# Patient Record
Sex: Female | Born: 1935 | Race: Black or African American | Hispanic: No | Smoking: Never smoker
Health system: Southern US, Community
[De-identification: ages and names within clinical notes are randomized; demographics above are authoritative.]

## PROBLEM LIST (undated history)

## (undated) DIAGNOSIS — M1990 Unspecified osteoarthritis, unspecified site: Secondary | ICD-10-CM

## (undated) DIAGNOSIS — F0390 Unspecified dementia without behavioral disturbance: Secondary | ICD-10-CM

## (undated) DIAGNOSIS — M1710 Unilateral primary osteoarthritis, unspecified knee: Secondary | ICD-10-CM

## (undated) DIAGNOSIS — I10 Essential (primary) hypertension: Secondary | ICD-10-CM

## (undated) DIAGNOSIS — E7800 Pure hypercholesterolemia, unspecified: Secondary | ICD-10-CM

## (undated) DIAGNOSIS — I350 Nonrheumatic aortic (valve) stenosis: Secondary | ICD-10-CM

## (undated) DIAGNOSIS — E785 Hyperlipidemia, unspecified: Secondary | ICD-10-CM

## (undated) DIAGNOSIS — E669 Obesity, unspecified: Secondary | ICD-10-CM

## (undated) DIAGNOSIS — M179 Osteoarthritis of knee, unspecified: Secondary | ICD-10-CM

## (undated) DIAGNOSIS — I2510 Atherosclerotic heart disease of native coronary artery without angina pectoris: Secondary | ICD-10-CM

## (undated) HISTORY — DX: Unspecified osteoarthritis, unspecified site: M19.90

## (undated) HISTORY — DX: Pure hypercholesterolemia, unspecified: E78.00

## (undated) HISTORY — DX: Hyperlipidemia, unspecified: E78.5

## (undated) HISTORY — DX: Unilateral primary osteoarthritis, unspecified knee: M17.10

## (undated) HISTORY — DX: Atherosclerotic heart disease of native coronary artery without angina pectoris: I25.10

## (undated) HISTORY — DX: Essential (primary) hypertension: I10

## (undated) HISTORY — DX: Obesity, unspecified: E66.9

## (undated) HISTORY — DX: Nonrheumatic aortic (valve) stenosis: I35.0

## (undated) HISTORY — PX: BACK SURGERY: SHX140

## (undated) HISTORY — DX: Osteoarthritis of knee, unspecified: M17.9

---

## 2003-01-12 ENCOUNTER — Ambulatory Visit (HOSPITAL_COMMUNITY): Admission: RE | Admit: 2003-01-12 | Discharge: 2003-01-12 | Payer: Self-pay | Admitting: *Deleted

## 2003-01-19 ENCOUNTER — Ambulatory Visit (HOSPITAL_COMMUNITY): Admission: RE | Admit: 2003-01-19 | Discharge: 2003-01-19 | Payer: Self-pay | Admitting: *Deleted

## 2003-10-18 ENCOUNTER — Ambulatory Visit (HOSPITAL_COMMUNITY): Admission: RE | Admit: 2003-10-18 | Discharge: 2003-10-18 | Payer: Self-pay | Admitting: Family Medicine

## 2006-03-21 ENCOUNTER — Ambulatory Visit (HOSPITAL_COMMUNITY): Admission: RE | Admit: 2006-03-21 | Discharge: 2006-03-21 | Payer: Self-pay | Admitting: Gastroenterology

## 2010-06-25 ENCOUNTER — Encounter: Payer: Self-pay | Admitting: Cardiology

## 2010-06-28 ENCOUNTER — Encounter: Payer: Self-pay | Admitting: Cardiology

## 2010-06-28 ENCOUNTER — Ambulatory Visit (INDEPENDENT_AMBULATORY_CARE_PROVIDER_SITE_OTHER): Payer: PRIVATE HEALTH INSURANCE | Admitting: Cardiology

## 2010-06-28 VITALS — BP 140/80 | HR 74 | Ht 61.0 in | Wt 193.0 lb

## 2010-06-28 DIAGNOSIS — I350 Nonrheumatic aortic (valve) stenosis: Secondary | ICD-10-CM | POA: Insufficient documentation

## 2010-06-28 DIAGNOSIS — E119 Type 2 diabetes mellitus without complications: Secondary | ICD-10-CM | POA: Insufficient documentation

## 2010-06-28 DIAGNOSIS — E7800 Pure hypercholesterolemia, unspecified: Secondary | ICD-10-CM

## 2010-06-28 DIAGNOSIS — I10 Essential (primary) hypertension: Secondary | ICD-10-CM | POA: Insufficient documentation

## 2010-06-28 DIAGNOSIS — I359 Nonrheumatic aortic valve disorder, unspecified: Secondary | ICD-10-CM

## 2010-06-28 NOTE — Assessment & Plan Note (Signed)
[name] [name] has moderate to severe aortic stenosis. She is asymptomatic. I have recommended a followup echocardiogram to compare with her study in 2011. If there is no significant change we will clear her for her knee surgery. If her aortic valve disease has progressed significantly we will need to consider further evaluation with cardiac catheterization and potential valve replacement. I did explain to the patient that she will eventually need aortic valve surgery.

## 2010-06-28 NOTE — Assessment & Plan Note (Signed)
Patient has severe hypercholesterolemia but is intolerant to statin therapy. She is on fenofibrate.

## 2010-06-28 NOTE — Patient Instructions (Signed)
We will schedule you for an echocardiogram to assess your heart valve.  If it is unchanged from last year we will clear you for your knee surgery.  If the aortic stenosis has worsened we may need to evaluate your valve further.

## 2010-06-28 NOTE — Progress Notes (Signed)
   [name] [name] Date of Birth: [birth year]   History of Present Illness: [name] [name] is seen for preoperative clearance for right knee surgery. She has a known history of aortic stenosis. Her last echocardiogram was in June of 2011 which demonstrated a peak velocity of 3 m/s with a mean gradient of 21 mm of mercury and an aortic valve area of 0.8-1.0 cm. This was stable compared to prior echocardiogram in February 2010. [name] [name] remains asymptomatic. She denies any chest pain, shortness of breath, or dizziness. She's had no palpitations. She is somewhat limited in her activity because of her knee problem.  Current Outpatient Prescriptions on File Prior to Visit  Medication Sig Dispense Refill  . DIOVAN HCT 160-25 MG per tablet Take 1 tablet by mouth Daily.      . FENOFIBRATE PO Take by mouth daily.        Marland Kitchen METFORMIN HCL PO Take 1,000 mg by mouth 2 (two) times daily.       . metoprolol succinate (TOPROL-XL) 25 MG 24 hr tablet Take 1 tablet by mouth Daily.      Marland Kitchen DISCONTD: Olmesartan Medoxomil-HCTZ (BENICAR HCT PO) Take by mouth daily.          Allergies  Allergen Reactions  . Aspirin   . Crestor (Rosuvastatin Calcium)   . Lipitor (Atorvastatin Calcium)   . Metoprolol   . Norvasc (Amlodipine Besylate)     Past Medical History  Diagnosis Date  . Diabetes mellitus   . Hypertension   . Aortic stenosis   . Hypercholesterolemia   . OA (osteoarthritis)     No past surgical history on file.  History  Smoking status  . Never Smoker   Smokeless tobacco  . Not on file    History  Alcohol Use No    Family History  Problem Relation Age of Onset  . Tuberculosis Mother   . Heart disease Father     Review of Systems: The review of systems is positive for arthritic pain in her right knee.  All other systems were reviewed and are negative.  Physical Exam: BP 140/80  Pulse 74  Ht 5\' 1"  (1.549 m)  Wt 193 lb (87.544 kg)  BMI 36.47 kg/m2 She is an overweight black  female in no acute distress. HEENT exam is unremarkable. Neck is without JVD, adenopathy, thyromegaly, or bruits. Her carotid upstrokes are good. She does have a radiated murmur. Lungs are clear. Cardiac exam reveals a harsh grade 2/6 systolic murmur in aortic area radiating to the left sternal border. She has normal S2. There is no diastolic murmur. Abdomen is soft and nontender without masses or bruits. Studies are without edema. Pulses are 2+ and symmetric. Skin is warm and dry. She is alert and oriented x3. Cranial nerves II through XII are intact. LABORATORY DATA: Blood work dated June 22, 2010 shows a hemoglobin of 10.8 and hematocrit 34.2. White blood count and platelet count were normal. Glucose 141. A1c was 7%. TSH was 2.43. Cholesterol 292, triglycerides 103, HDL 60, LDL 211. Chemistries were otherwise normal. ECG showed sinus bradycardia with nonspecific ST abnormality.  Assessment / Plan:

## 2010-07-11 ENCOUNTER — Ambulatory Visit (HOSPITAL_COMMUNITY): Payer: Medicare (Managed Care) | Attending: Cardiology | Admitting: Radiology

## 2010-07-11 DIAGNOSIS — I359 Nonrheumatic aortic valve disorder, unspecified: Secondary | ICD-10-CM

## 2010-07-11 DIAGNOSIS — I079 Rheumatic tricuspid valve disease, unspecified: Secondary | ICD-10-CM | POA: Insufficient documentation

## 2010-07-11 DIAGNOSIS — I379 Nonrheumatic pulmonary valve disorder, unspecified: Secondary | ICD-10-CM | POA: Insufficient documentation

## 2010-07-11 DIAGNOSIS — E785 Hyperlipidemia, unspecified: Secondary | ICD-10-CM | POA: Insufficient documentation

## 2010-07-11 DIAGNOSIS — I350 Nonrheumatic aortic (valve) stenosis: Secondary | ICD-10-CM

## 2010-07-11 DIAGNOSIS — I080 Rheumatic disorders of both mitral and aortic valves: Secondary | ICD-10-CM | POA: Insufficient documentation

## 2010-07-11 DIAGNOSIS — I10 Essential (primary) hypertension: Secondary | ICD-10-CM | POA: Insufficient documentation

## 2010-07-11 DIAGNOSIS — E119 Type 2 diabetes mellitus without complications: Secondary | ICD-10-CM | POA: Insufficient documentation

## 2010-07-13 ENCOUNTER — Telehealth: Payer: Self-pay | Admitting: *Deleted

## 2010-07-13 NOTE — Telephone Encounter (Signed)
Spoke w/ daughter Almyra Free. Advised of Echo results. Made her an app to see Dr. Swaziland 7/10 to discuss cath. Sent note to Dr. Sherene Sires office that she was no cleared for total knee replacement until has cath. Advised daughter that she would not be cleared.

## 2010-07-13 NOTE — Telephone Encounter (Signed)
Message copied by Lorayne Bender on Fri Jul 13, 2010  1:38 PM ------      Message from: [place], PETER M      Created: Fri Jul 13, 2010 12:20 PM       Her aortic stenosis has progressed significantly since last year with increase in velocity from 3 to 4.1 cm/sec and increase in mean gradient from 21 to 39 mmHg. I think we need to consider her for cardiac cath and possible valve replacement. I will not clear her for knee surgery. I would like to see her back to review results and discuss cath.       Theron Arista [place]

## 2010-07-20 ENCOUNTER — Other Ambulatory Visit (HOSPITAL_COMMUNITY): Payer: Self-pay

## 2010-07-24 ENCOUNTER — Ambulatory Visit (INDEPENDENT_AMBULATORY_CARE_PROVIDER_SITE_OTHER): Payer: Medicare (Managed Care) | Admitting: Cardiology

## 2010-07-24 ENCOUNTER — Encounter: Payer: Self-pay | Admitting: Cardiology

## 2010-07-24 ENCOUNTER — Ambulatory Visit
Admission: RE | Admit: 2010-07-24 | Discharge: 2010-07-24 | Disposition: A | Discharge status: Home / Self Care | Payer: Medicare (Managed Care) | Source: Ambulatory Visit | Attending: Cardiology | Admitting: Cardiology

## 2010-07-24 VITALS — BP 188/104 | HR 72 | Ht 63.0 in | Wt 193.6 lb

## 2010-07-24 DIAGNOSIS — M79609 Pain in unspecified limb: Secondary | ICD-10-CM

## 2010-07-24 DIAGNOSIS — I350 Nonrheumatic aortic (valve) stenosis: Secondary | ICD-10-CM

## 2010-07-24 DIAGNOSIS — I359 Nonrheumatic aortic valve disorder, unspecified: Secondary | ICD-10-CM

## 2010-07-24 LAB — CBC WITH DIFFERENTIAL/PLATELET
Basophils Absolute: 0 10*3/uL (ref 0.0–0.1)
Basophils Relative: 0.4 % (ref 0.0–3.0)
HCT: 32.4 % — ABNORMAL LOW (ref 36.0–46.0)
Hemoglobin: 10.7 g/dL — ABNORMAL LOW (ref 12.0–15.0)
Lymphocytes Relative: 30.2 % (ref 12.0–46.0)
Lymphs Abs: 1.5 10*3/uL (ref 0.7–4.0)
MCHC: 32.9 g/dL (ref 30.0–36.0)
Monocytes Relative: 7.5 % (ref 3.0–12.0)
Neutro Abs: 3 10*3/uL (ref 1.4–7.7)
RBC: 4.07 Mil/uL (ref 3.87–5.11)
RDW: 16.5 % — ABNORMAL HIGH (ref 11.5–14.6)

## 2010-07-24 LAB — APTT: aPTT: 31.7 s — ABNORMAL HIGH (ref 21.7–28.8)

## 2010-07-24 LAB — BASIC METABOLIC PANEL
CO2: 27 mEq/L (ref 19–32)
Calcium: 8.8 mg/dL (ref 8.4–10.5)
GFR: 86.47 mL/min (ref >60.00)
Glucose, Bld: 135 mg/dL — ABNORMAL HIGH (ref 70–99)
Potassium: 4.1 mEq/L (ref 3.5–5.1)
Sodium: 141 mEq/L (ref 135–145)

## 2010-07-24 NOTE — Assessment & Plan Note (Signed)
Her aortic stenosis has progressed significantly. It is now severe. Her serial echocardiograms have demonstrated progressive disease. It is difficult to tell if she is symptomatic since her activity is limited. I recommended pursuing diagnostic cardiac catheterization. I would recommend elective aortic valve replacement. We'll postpone her knee surgery as this is elective.

## 2010-07-24 NOTE — Progress Notes (Signed)
   [name] Date of Birth: February 15, 1934   History of Present Illness: Mrs. Niese is seen for followup after recent echocardiogram. This demonstrated significant increase in aortic valve gradient with a peak velocity of 4 m/s and a mean gradient of 39 mm of mercury. Her valve area was 0.85 cm. She denies significant chest pain, shortness of breath, or lightheadedness. Her activity is very limited because of her osteoarthritis. Her daughter notes that she does appear to get out of breath easily with activity.  Current Outpatient Prescriptions on File Prior to Visit  Medication Sig Dispense Refill  . DIOVAN HCT 160-25 MG per tablet Take 1 tablet by mouth Daily.      . FENOFIBRATE PO Take by mouth daily.        Marland Kitchen METFORMIN HCL PO Take 1,000 mg by mouth 2 (two) times daily.       . metoprolol succinate (TOPROL-XL) 25 MG 24 hr tablet Take 1 tablet by mouth Daily.        Allergies  Allergen Reactions  . Aspirin   . Crestor (Rosuvastatin Calcium)   . Lipitor (Atorvastatin Calcium)   . Metoprolol   . Norvasc (Amlodipine Besylate)     Past Medical History  Diagnosis Date  . Diabetes mellitus   . Hypertension   . Aortic stenosis   . Hypercholesterolemia   . OA (osteoarthritis)     History reviewed. No pertinent past surgical history.  History  Smoking status  . Never Smoker   Smokeless tobacco  . Not on file    History  Alcohol Use No    Family History  Problem Relation Age of Onset  . Tuberculosis Mother   . Heart disease Father     Review of Systems: The review of systems is positive for arthritic pain in her right knee.  All other systems were reviewed and are negative.  Physical Exam: BP 188/104  Pulse 72  Ht 5\' 3"  (1.6 m)  Wt 193 lb 9.6 oz (87.816 kg)  BMI 34.29 kg/m2 She is an overweight black female in no acute distress. HEENT exam is unremarkable. Neck is without JVD, adenopathy, thyromegaly, or bruits. Her carotid upstrokes are good. She does have a  radiated murmur. Lungs are clear. Cardiac exam reveals a harsh grade 2/6 systolic murmur in aortic area radiating to the left sternal border. She has normal S2. There is no diastolic murmur. Abdomen is soft and nontender without masses or bruits. Studies are without edema. Pulses are 2+ and symmetric. Skin is warm and dry. She is alert and oriented x3. Cranial nerves II through XII are intact. LABORATORY DATA: Blood work dated June 22, 2010 shows a hemoglobin of 10.8 and hematocrit 34.2. White blood count and platelet count were normal. Glucose 141. A1c was 7%. TSH was 2.43. Cholesterol 292, triglycerides 103, HDL 60, LDL 211. Chemistries were otherwise normal. ECG showed sinus bradycardia with nonspecific ST abnormality.  Assessment / Plan:

## 2010-07-24 NOTE — Patient Instructions (Signed)
We will schedule you for a cardiac catheterization.  Continue your current medications.   We will check lab work and a chest Xray today.Angiography Angiography is a procedure used to look at the blood vessels (arteries) which carry the blood to different parts of your body. In this procedure a dye is injected through a catheter (a long, hollow tube about the size of a piece of cooked spaghetti) into an artery and x-rays are taken. The x-rays will show if there is a blockage or problem in a blood vessel.  PREPARATION FOR THE PROCEDURE  Let your caregiver know if you have had an allergy to dyes used in x-ray if you have ever had kidney problems or failure.   Do not eat or drink starting from midnight up to the time of the procedure, or as directed.   You may drink enough water to take your medications the morning of the procedure if you were instructed to do so.   You should be at the hospital or outpatient facility where the procedure is to be done 1 hour prior to the procedure or as directed.  PROCEDURE: 1. You may be given a medication to help you relax before and during the procedure through an IV in your hand or arm.  2. A local anesthetic to make the area numb may be used before inserting the catheter.  3. You will be prepared for the procedure by washing and shaving the area where the catheter will be inserted. This is usually done in the groin but may be done in the fold of your arm by your elbow.  4. A specially trained doctor will insert the catheter with a guide wire into an artery. This is guided under a special type of x-ray (fluoroscopy) to the blood vessel being examined.  5. Special dye is then injected and x-rays are taken. These will show where any narrowing or blockages are located.  AFTER THE PROCEDURE  After the procedure you will be kept in bed for several hours.   The access site will be watched and you will be checked frequently.   Blood tests, other x-rays and an  EKG may be done.   You may stay in the hospital overnight for observation.  SEEK IMMEDIATE MEDICAL CARE IF:  You develop chest pain, shortness of breath, feel faint, or pass out.   There is bleeding, swelling, or drainage from the catheter insertion site.   You develop pain, discoloration, coldness, or severe bruising in the leg or arm, or area where the catheter was inserted.  Document Released: 10/10/2004 Document Re-Released: 05/02/2007 [facility] Patient Information 2011 [place], [place].

## 2010-07-25 ENCOUNTER — Inpatient Hospital Stay (HOSPITAL_COMMUNITY)
Admission: RE | Admit: 2010-07-25 | Payer: Medicare (Managed Care) | Source: Ambulatory Visit | Admitting: Orthopedic Surgery

## 2010-07-25 ENCOUNTER — Inpatient Hospital Stay (HOSPITAL_BASED_OUTPATIENT_CLINIC_OR_DEPARTMENT_OTHER)
Admission: RE | Admit: 2010-07-25 | Discharge: 2010-07-25 | Disposition: A | Discharge status: Home / Self Care | Payer: Medicare (Managed Care) | Source: Ambulatory Visit | Attending: Cardiology | Admitting: Cardiology

## 2010-07-25 DIAGNOSIS — I359 Nonrheumatic aortic valve disorder, unspecified: Secondary | ICD-10-CM | POA: Insufficient documentation

## 2010-07-25 DIAGNOSIS — I2510 Atherosclerotic heart disease of native coronary artery without angina pectoris: Secondary | ICD-10-CM | POA: Insufficient documentation

## 2010-07-25 DIAGNOSIS — R0989 Other specified symptoms and signs involving the circulatory and respiratory systems: Secondary | ICD-10-CM | POA: Insufficient documentation

## 2010-07-25 DIAGNOSIS — R0609 Other forms of dyspnea: Secondary | ICD-10-CM | POA: Insufficient documentation

## 2010-07-25 HISTORY — PX: CARDIAC CATHETERIZATION: SHX172

## 2010-07-25 LAB — POCT I-STAT 3, VENOUS BLOOD GAS (G3P V)
Acid-base deficit: 3 mmol/L — ABNORMAL HIGH (ref 0.0–2.0)
Bicarbonate: 23.7 mEq/L (ref 20.0–24.0)
O2 Saturation: 70 %
TCO2: 25 mmol/L (ref 0–100)
pCO2, Ven: 48.9 mmHg (ref 45.0–50.0)
pO2, Ven: 41 mmHg (ref 30.0–45.0)

## 2010-07-25 LAB — POCT I-STAT 3, ART BLOOD GAS (G3+)
Bicarbonate: 23.8 mEq/L (ref 20.0–24.0)
TCO2: 25 mmol/L (ref 0–100)
pH, Arterial: 7.355 (ref 7.350–7.400)
pO2, Arterial: 98 mmHg (ref 80.0–100.0)

## 2010-07-27 ENCOUNTER — Telehealth: Payer: Self-pay | Admitting: *Deleted

## 2010-07-27 NOTE — Telephone Encounter (Signed)
Had called daughter to clarify dates for FMLA form. She returned called and said her mother has an app w/surgeon on 7/24 to discuss when surgery will be. Will fill form out for intermittent leave from 07/25/10-08/07/10. Faxed to Rosann Auerbach [phone] att: Satira Mccallum.

## 2010-08-02 NOTE — Cardiovascular Report (Signed)
  NAMEMarland Kitchen  [name], [name] NO.:  [id number]  MEDICAL RECORD NO.:  [id number]  LOCATION:                                 FACILITY:  PHYSICIAN:  Peter M. Swaziland, M.D.  DATE OF BIRTH:  07-22-1934  DATE OF PROCEDURE:  07/25/2010 DATE OF DISCHARGE:                           CARDIAC CATHETERIZATION   INDICATIONS FOR PROCEDURE:  A 77 year old female who was seen for preoperative clearance for knee surgery.  She has progressive aortic stenosis that is now severe.  She does have symptoms of dyspnea. Cardiac catheterization was indicated for evaluation for surgical treatment of her aortic valve disease.  PROCEDURES:  Right and left heart catheterization, coronary angiography, access via the right femoral artery and vein using the standard Seldinger technique.  EQUIPMENT:  A 4-French arterial sheath, 4-French pigtail catheter, 4- French 4-cm left Judkins catheter, 4-French 3D RC catheter, 7-French balloon-tip Swan-Ganz catheter, 7-French venous sheath.  MEDICATIONS:  Local anesthesia with 1% Xylocaine, Versed 1 mg IV, fentanyl 25 mcg.  CONTRAST:  70 mL of Omnipaque.  HEMODYNAMIC DATA:  Right atrial pressure is 10/7 with a mean of 7 mmHg. Right ventricular pressure is 30 with an EDP of 8 mmHg.  Pulmonary artery pressure is 28/9 with a mean of 17 mmHg.  Pulmonary capillary wedge pressure is 14/12 with a mean of 5 mmHg.  By pullback, left ventricular pressure is 165 with an EDP of 15 mmHg.  Aortic pressure is 143/60 with a mean of 92 mmHg.  Aortic valve mean gradient is 26 mmHg. Using thermodilution cardiac output, it shows valve area of 0.77 cm2, valve area of 0.41.  Valve area index of 0.41.  Thermodilution cardiac output was 3.8 L per minute with an index of 2.1.  By [facility] cardiac output 5.9 with an index of 8.2.  ANGIOGRAPHIC DATA:  Left coronary artery arises and distributes normally.  Left main coronary artery is normal.  Left anterior descending artery is  moderately calcified throughout the proximal mid vessel.  There are tandem focal lesions in mid LAD at 78% each.  Left circumflex coronary artery is a large vessel which has a 20% ostial stenosis.  The right coronary artery is a dominant vessel that has mild irregularity __________  Left ventricular angiography was not performed.  IMPRESSION: 1. Single-vessel obstructive atherosclerotic coronary artery disease. 2. Severe aortic stenosis. 3. Normal right heart pressures.  PLAN:  Obtain surgical consultation with possible aortic valve replacement and bypass surgery.          ______________________________ Peter M. Swaziland, M.D.     PMJ/MEDQ  D:  07/25/2010  T:  07/26/2010  Job:  [id number]  Electronically Signed by PETER Swaziland M.D. on 08/02/2010 11:48:32 AM

## 2010-08-07 ENCOUNTER — Encounter: Payer: Medicare (Managed Care) | Admitting: Surgery

## 2010-08-07 ENCOUNTER — Encounter (INDEPENDENT_AMBULATORY_CARE_PROVIDER_SITE_OTHER): Payer: Medicare (Managed Care) | Admitting: Surgery

## 2010-08-07 DIAGNOSIS — I2510 Atherosclerotic heart disease of native coronary artery without angina pectoris: Secondary | ICD-10-CM

## 2010-08-07 DIAGNOSIS — I359 Nonrheumatic aortic valve disorder, unspecified: Secondary | ICD-10-CM

## 2010-08-09 NOTE — Consult Note (Addendum)
NEW PATIENT CONSULTATION  [name] [name], [name] [name] DOB:  1934/09/15                                        August 08, 2010 CHART #:  [id number]  REASON FOR CONSULTATION:  Severe aortic stenosis and single-vessel coronary artery disease.  CLINICAL HISTORY:  I was asked by Dr. Swaziland to evaluate the patient for consideration of aortic valve replacement and coronary artery bypass to the LAD.  She is a 76 year old woman with a history of hypertension and hypercholesterolemia as well as diabetes and known aortic stenosis.  She was undergoing preop cardiac evaluation for planned right knee replacement for severe degenerative arthritis.  She denied any significant symptoms.  She had a echocardiogram on July 11, 2010, which showed progression of her aortic stenosis into the severe range with a mean gradient of 39, a peak gradient of 69 with an aortic valve area of 0.9 cm2 by VTI and 0.8 cm2 by VMAX.  Left ventricular ejection fraction was 55-60% with no regional wall motion abnormalities.  There is moderate concentric left ventricular hypertrophy.  There is a calcified mitral annulus with no evidence of stenosis and mild regurgitation. Right ventricular function was normal.  There is trivial tricuspid regurgitation.  There is trivial pulmonary valve regurgitation.  She subsequently underwent cardiac catheterization by Dr. Swaziland on July 25, 2010.  This showed a right atrial pressure of 10/7 with a mean of 7. Right ventricular pressure was 30.  PA pressure was 28/9 with a wedge pressure of 14/12 and a mean of 5.  The aortic valve mean gradient was 26 mmHg.  Valve area was calculated 0.77 cm2 by thermodilution and 0.41 by Fick.  Angiography showed the LAD to have tandem focal 70-80% stenoses in the midportion of the LAD.  The left circumflex and right coronary arteries had no significant disease.  REVIEW OF SYSTEMS:  GENERAL:  She denies any fever or chills.  She has had some  weight gain.  She said that she does not stick to a good diet. She does not check her blood sugars.  She denies any fatigue, but is not very active due to her right knee pain. EYES:  Negative. ENT:  Negative. ENDOCRINE:  She has adult-onset diabetes.  She does not check her blood sugars.  All of them are due to the expense of the strips.  She denies hypothyroidism.  CARDIOVASCULAR:  She denies any chest pain or pressure. She denies exertional dyspnea.  She denies PND and orthopnea. EXTREMITIES:  She has had no peripheral edema or palpitations. RESPIRATORY:  She denies cough and sputum production. GI:  She denies nausea or vomiting.  She denies melena and bright red blood per rectum. GU:  She denies dysuria and hematuria. VASCULAR:  She denies claudication and phlebitis. MUSCULOSKELETAL:  She does have degenerative arthritis in her right knee and is supposed to undergo a right knee replacement by Dr. Richardson Landry. NEUROLOGICAL:  She denies any focal weakness or numbness.  She denies dizziness or syncope.  She has never had a TIA or stroke. PSYCHIATRIC:  Negative. HEMATOLOGICAL:  Negative.  ALLERGIES:  To aspirin which causes nausea or vomiting as well as Crestor and Lipitor both of which caused back pain in her muscles.  She also is allergic to Norvasc causing swelling.  PAST MEDICAL HISTORY:  Significant for diabetes mellitus that is type 2 as  well as hypertension and hypercholesterolemia.  She has known history of aortic stenosis.  She has history of degenerative arthritis of her right knee.  FAMILY HISTORY:  Negative for cardiac or valve disease.  SOCIAL HISTORY:  She is widowed and has 3 adult children.  Two of her daughters are with her today.  She has never smoked.  Denies alcohol use.  PHYSICAL EXAMINATION:  Vital Signs:  Her blood pressure is 146/72, pulse 56 and regular, respiratory rate is 18 and unlabored and oxygen saturation on room air is 98%. General:  She is a  well-developed black female, in no distress.  HEENT: Normocephalic and atraumatic.  Pupils are equal and reactive to light and accommodation.  Extraocular muscles are intact.  Oropharynx is clear.  Neck:  Normal carotid pulses bilaterally.  There is a transmitted murmur or bruit to both sides of her neck.  There is no adenopathy or thyromegaly.  Cardiac:  Regular rate and rhythm with a grade 2/6 systolic murmur over the aorta.  There is no diastolic murmur. Lungs:  Clear.  Abdomen:  Active bowel sounds.  Abdomen is soft and nontender.  There are no palpable masses or organomegaly.  Extremities: No peripheral edema.  Pedal pulses are palpable bilaterally.  Skin: Warm and dry.  Neurologic:  Alert and oriented x3.  Motor and sensory exams are grossly normal.  IMPRESSION:  The patient has severe aortic stenosis and significant single-vessel coronary artery disease involving the LAD.  She really denies any symptoms, although she is very sedentary due to her degenerative arthritis.  I agree that she should undergo aortic valve replacement and coronary artery bypass to the LAD to decrease her risk of ischemia and infarction as well as progressive congestive heart failure symptoms secondary to aortic stenosis.  This should be done prior to her undergoing knee replacement.  If her surgery goes well, I would expect that she would be able to have her knee replaced after about 3 months recovery.  I discussed the operative procedure of aortic valve replacement and coronary artery bypass x1 to the LAD with her and her 2 daughters.  We discussed alternatives, benefits, and risks including, but not limited to bleeding, blood transfusion, infection, stroke, myocardial infarction, graft failure, heart block requiring permanent pacemaker, organ dysfunction, and death.  I discussed the pros and cons of mechanical and tissue valves and my recommendation for a tissue valve given her age and degenerative  arthritis.  She is in agreement with that.  Her daughter is going to call the office in the next couple days to schedule surgery after they look at their schedule.  Evelene Croon, M.D. Electronically Signed  BB/MEDQ  D:  08/08/2010  T:  08/09/2010  Job:  [id number]  cc:   Peter M. [place]il[place], M.D. Loreta Ave, M.D.

## 2010-08-15 ENCOUNTER — Encounter (HOSPITAL_COMMUNITY)
Admission: RE | Admit: 2010-08-15 | Discharge: 2010-08-15 | Disposition: A | Discharge status: Home / Self Care | Payer: Medicare (Managed Care) | Source: Ambulatory Visit | Attending: Surgery | Admitting: Surgery

## 2010-08-15 ENCOUNTER — Other Ambulatory Visit: Payer: Self-pay | Admitting: Surgery

## 2010-08-15 ENCOUNTER — Ambulatory Visit (HOSPITAL_COMMUNITY)
Admission: RE | Admit: 2010-08-15 | Discharge: 2010-08-15 | Disposition: A | Discharge status: Home / Self Care | Payer: Medicare (Managed Care) | Source: Ambulatory Visit | Attending: Surgery | Admitting: Surgery

## 2010-08-15 DIAGNOSIS — Z01812 Encounter for preprocedural laboratory examination: Secondary | ICD-10-CM | POA: Insufficient documentation

## 2010-08-15 DIAGNOSIS — I2510 Atherosclerotic heart disease of native coronary artery without angina pectoris: Secondary | ICD-10-CM

## 2010-08-15 DIAGNOSIS — I10 Essential (primary) hypertension: Secondary | ICD-10-CM | POA: Insufficient documentation

## 2010-08-15 DIAGNOSIS — Z01810 Encounter for preprocedural cardiovascular examination: Secondary | ICD-10-CM

## 2010-08-15 DIAGNOSIS — E119 Type 2 diabetes mellitus without complications: Secondary | ICD-10-CM | POA: Insufficient documentation

## 2010-08-15 DIAGNOSIS — Z01811 Encounter for preprocedural respiratory examination: Secondary | ICD-10-CM | POA: Insufficient documentation

## 2010-08-15 DIAGNOSIS — I6529 Occlusion and stenosis of unspecified carotid artery: Secondary | ICD-10-CM | POA: Insufficient documentation

## 2010-08-15 LAB — COMPREHENSIVE METABOLIC PANEL
ALT: 13 U/L (ref 0–35)
BUN: 15 mg/dL (ref 6–23)
CO2: 24 mEq/L (ref 19–32)
Calcium: 9.5 mg/dL (ref 8.4–10.5)
Creatinine, Ser: 0.73 mg/dL (ref 0.50–1.10)
GFR calc Af Amer: >60 mL/min (ref >60)
GFR calc non Af Amer: >60 mL/min (ref >60)
Glucose, Bld: 150 mg/dL — ABNORMAL HIGH (ref 70–99)
Sodium: 139 mEq/L (ref 135–145)

## 2010-08-15 LAB — BLOOD GAS, ARTERIAL
Acid-Base Excess: 1.3 mmol/L (ref 0.0–2.0)
Bicarbonate: 25.6 mEq/L — ABNORMAL HIGH (ref 20.0–24.0)
Patient temperature: 98.6
TCO2: 26.9 mmol/L (ref 0–100)
pH, Arterial: 7.399 (ref 7.350–7.400)

## 2010-08-15 LAB — URINALYSIS, ROUTINE W REFLEX MICROSCOPIC
Bilirubin Urine: NEGATIVE
Glucose, UA: NEGATIVE mg/dL
Ketones, ur: NEGATIVE mg/dL
Leukocytes, UA: NEGATIVE
Nitrite: NEGATIVE
Specific Gravity, Urine: 1.015 (ref 1.005–1.030)
pH: 6.5 (ref 5.0–8.0)

## 2010-08-15 LAB — CBC
HCT: 33.2 % — ABNORMAL LOW (ref 36.0–46.0)
Hemoglobin: 10.9 g/dL — ABNORMAL LOW (ref 12.0–15.0)
MCHC: 32.8 g/dL (ref 30.0–36.0)
RBC: 4.29 MIL/uL (ref 3.87–5.11)

## 2010-08-15 LAB — ABO/RH: ABO/RH(D): A NEG

## 2010-08-15 LAB — SURGICAL PCR SCREEN: Staphylococcus aureus: NEGATIVE

## 2010-08-15 LAB — PROTIME-INR: Prothrombin Time: 13.4 seconds (ref 11.6–15.2)

## 2010-08-17 ENCOUNTER — Inpatient Hospital Stay (HOSPITAL_COMMUNITY): Payer: Medicare (Managed Care)

## 2010-08-17 ENCOUNTER — Inpatient Hospital Stay (HOSPITAL_COMMUNITY)
Admission: RE | Admit: 2010-08-17 | Discharge: 2010-08-21 | DRG: 220 | Disposition: A | Discharge status: Home Health | Payer: Medicare (Managed Care) | Source: Ambulatory Visit | Attending: Surgery | Admitting: Surgery

## 2010-08-17 ENCOUNTER — Other Ambulatory Visit: Payer: Self-pay | Admitting: Surgery

## 2010-08-17 DIAGNOSIS — E7800 Pure hypercholesterolemia, unspecified: Secondary | ICD-10-CM | POA: Diagnosis present

## 2010-08-17 DIAGNOSIS — I359 Nonrheumatic aortic valve disorder, unspecified: Secondary | ICD-10-CM

## 2010-08-17 DIAGNOSIS — E119 Type 2 diabetes mellitus without complications: Secondary | ICD-10-CM | POA: Diagnosis present

## 2010-08-17 DIAGNOSIS — I2510 Atherosclerotic heart disease of native coronary artery without angina pectoris: Secondary | ICD-10-CM | POA: Diagnosis present

## 2010-08-17 DIAGNOSIS — I10 Essential (primary) hypertension: Secondary | ICD-10-CM | POA: Diagnosis present

## 2010-08-17 DIAGNOSIS — M1710 Unilateral primary osteoarthritis, unspecified knee: Secondary | ICD-10-CM | POA: Diagnosis present

## 2010-08-17 DIAGNOSIS — IMO0002 Reserved for concepts with insufficient information to code with codable children: Secondary | ICD-10-CM | POA: Diagnosis present

## 2010-08-17 DIAGNOSIS — D62 Acute posthemorrhagic anemia: Secondary | ICD-10-CM | POA: Diagnosis not present

## 2010-08-17 HISTORY — PX: CORONARY ARTERY BYPASS GRAFT: SHX141

## 2010-08-17 HISTORY — PX: AORTIC VALVE REPLACEMENT: SHX41

## 2010-08-17 LAB — POCT I-STAT 4, (NA,K, GLUC, HGB,HCT)
Glucose, Bld: 138 mg/dL — ABNORMAL HIGH (ref 70–99)
Glucose, Bld: 117 mg/dL — ABNORMAL HIGH (ref 70–99)
Glucose, Bld: 120 mg/dL — ABNORMAL HIGH (ref 70–99)
HCT: 29 % — ABNORMAL LOW (ref 36.0–46.0)
HCT: 20 % — ABNORMAL LOW (ref 36.0–46.0)
HCT: 33 % — ABNORMAL LOW (ref 36.0–46.0)
Hemoglobin: 9.5 g/dL — ABNORMAL LOW (ref 12.0–15.0)
Hemoglobin: 6.8 g/dL — CL (ref 12.0–15.0)
Hemoglobin: 8.8 g/dL — ABNORMAL LOW (ref 12.0–15.0)
Potassium: 3.6 mEq/L (ref 3.5–5.1)
Potassium: 3.9 mEq/L (ref 3.5–5.1)
Potassium: 3.3 mEq/L — ABNORMAL LOW (ref 3.5–5.1)
Sodium: 137 mEq/L (ref 135–145)
Sodium: 138 mEq/L (ref 135–145)

## 2010-08-17 LAB — CBC
HCT: 32.6 % — ABNORMAL LOW (ref 36.0–46.0)
MCH: 26.3 pg (ref 26.0–34.0)
MCHC: 33.7 g/dL (ref 30.0–36.0)
MCV: 77.1 fL — ABNORMAL LOW (ref 78.0–100.0)
MCV: 77.4 fL — ABNORMAL LOW (ref 78.0–100.0)
Platelets: 133 10*3/uL — ABNORMAL LOW (ref 150–400)
RDW: 15.1 % (ref 11.5–15.5)
RDW: 15.1 % (ref 11.5–15.5)

## 2010-08-17 LAB — POCT I-STAT 3, ART BLOOD GAS (G3+)
Acid-base deficit: 2 mmol/L (ref 0.0–2.0)
Acid-base deficit: 3 mmol/L — ABNORMAL HIGH (ref 0.0–2.0)
Bicarbonate: 24.2 mEq/L — ABNORMAL HIGH (ref 20.0–24.0)
Bicarbonate: 22.6 mEq/L (ref 20.0–24.0)
O2 Saturation: 99 %
O2 Saturation: 99 %
Patient temperature: 37.4
TCO2: 24 mmol/L (ref 0–100)
pH, Arterial: 7.446 — ABNORMAL HIGH (ref 7.350–7.400)
pO2, Arterial: 341 mmHg — ABNORMAL HIGH (ref 80.0–100.0)

## 2010-08-17 LAB — GLUCOSE, CAPILLARY
Glucose-Capillary: 100 mg/dL — ABNORMAL HIGH (ref 70–99)
Glucose-Capillary: 90 mg/dL (ref 70–99)

## 2010-08-17 LAB — CREATININE, SERUM: GFR calc Af Amer: >60 mL/min (ref >60)

## 2010-08-17 LAB — HEMOGLOBIN AND HEMATOCRIT, BLOOD: HCT: 25.9 % — ABNORMAL LOW (ref 36.0–46.0)

## 2010-08-18 ENCOUNTER — Inpatient Hospital Stay (HOSPITAL_COMMUNITY): Payer: Medicare (Managed Care)

## 2010-08-18 DIAGNOSIS — E1165 Type 2 diabetes mellitus with hyperglycemia: Secondary | ICD-10-CM

## 2010-08-18 DIAGNOSIS — IMO0001 Reserved for inherently not codable concepts without codable children: Secondary | ICD-10-CM

## 2010-08-18 LAB — POCT I-STAT, CHEM 8
BUN: 12 mg/dL (ref 6–23)
Calcium, Ion: 1.14 mmol/L (ref 1.12–1.32)
Chloride: 105 mEq/L (ref 96–112)
Creatinine, Ser: 0.8 mg/dL (ref 0.50–1.10)
Glucose, Bld: 149 mg/dL — ABNORMAL HIGH (ref 70–99)
Potassium: 4.3 mEq/L (ref 3.5–5.1)
Sodium: 140 mEq/L (ref 135–145)
TCO2: 25 mmol/L (ref 0–100)

## 2010-08-18 LAB — GLUCOSE, CAPILLARY
Glucose-Capillary: 166 mg/dL — ABNORMAL HIGH (ref 70–99)
Glucose-Capillary: 180 mg/dL — ABNORMAL HIGH (ref 70–99)

## 2010-08-18 LAB — BASIC METABOLIC PANEL
BUN: 11 mg/dL (ref 6–23)
Calcium: 7.4 mg/dL — ABNORMAL LOW (ref 8.4–10.5)
Creatinine, Ser: 0.62 mg/dL (ref 0.50–1.10)
GFR calc Af Amer: >60 mL/min (ref >60)
GFR calc non Af Amer: >60 mL/min (ref >60)
Potassium: 3.9 mEq/L (ref 3.5–5.1)

## 2010-08-18 LAB — CBC
HCT: 29.7 % — ABNORMAL LOW (ref 36.0–46.0)
HCT: 21.9 % — ABNORMAL LOW (ref 36.0–46.0)
Hemoglobin: 7.3 g/dL — ABNORMAL LOW (ref 12.0–15.0)
MCH: 26.4 pg (ref 26.0–34.0)
MCHC: 34 g/dL (ref 30.0–36.0)
RBC: 2.75 MIL/uL — ABNORMAL LOW (ref 3.87–5.11)
RDW: 15.6 % — ABNORMAL HIGH (ref 11.5–15.5)
WBC: 7.5 10*3/uL (ref 4.0–10.5)

## 2010-08-18 LAB — CREATININE, SERUM: Creatinine, Ser: <0.47 mg/dL — ABNORMAL LOW (ref 0.50–1.10)

## 2010-08-19 ENCOUNTER — Inpatient Hospital Stay (HOSPITAL_COMMUNITY): Payer: Medicare (Managed Care)

## 2010-08-19 LAB — CBC
HCT: 28.6 % — ABNORMAL LOW (ref 36.0–46.0)
Hemoglobin: 9.4 g/dL — ABNORMAL LOW (ref 12.0–15.0)
MCH: 26 pg (ref 26.0–34.0)
MCHC: 32.9 g/dL (ref 30.0–36.0)
MCV: 79.2 fL (ref 78.0–100.0)
Platelets: 104 10*3/uL — ABNORMAL LOW (ref 150–400)
RBC: 3.61 MIL/uL — ABNORMAL LOW (ref 3.87–5.11)
RDW: 15.8 % — ABNORMAL HIGH (ref 11.5–15.5)
WBC: 9.2 10*3/uL (ref 4.0–10.5)

## 2010-08-19 LAB — BASIC METABOLIC PANEL
BUN: 16 mg/dL (ref 6–23)
CO2: 29 mEq/L (ref 19–32)
Calcium: 7.9 mg/dL — ABNORMAL LOW (ref 8.4–10.5)
Chloride: 102 mEq/L (ref 96–112)
Creatinine, Ser: 1.02 mg/dL (ref 0.50–1.10)
GFR calc Af Amer: >60 mL/min (ref >60)
GFR calc non Af Amer: 53 mL/min — ABNORMAL LOW (ref >60)
Glucose, Bld: 102 mg/dL — ABNORMAL HIGH (ref 70–99)
Potassium: 3.8 mEq/L (ref 3.5–5.1)
Sodium: 135 mEq/L (ref 135–145)

## 2010-08-19 LAB — GLUCOSE, CAPILLARY
Glucose-Capillary: 83 mg/dL (ref 70–99)
Glucose-Capillary: 107 mg/dL — ABNORMAL HIGH (ref 70–99)
Glucose-Capillary: 157 mg/dL — ABNORMAL HIGH (ref 70–99)
Glucose-Capillary: 137 mg/dL — ABNORMAL HIGH (ref 70–99)

## 2010-08-20 ENCOUNTER — Inpatient Hospital Stay (HOSPITAL_COMMUNITY): Payer: Medicare (Managed Care)

## 2010-08-20 LAB — BASIC METABOLIC PANEL
BUN: 13 mg/dL (ref 6–23)
CO2: 31 mEq/L (ref 19–32)
Calcium: 8.5 mg/dL (ref 8.4–10.5)
Chloride: 103 mEq/L (ref 96–112)
Creatinine, Ser: 0.7 mg/dL (ref 0.50–1.10)
GFR calc Af Amer: >60 mL/min (ref >60)
GFR calc non Af Amer: >60 mL/min (ref >60)
Glucose, Bld: 118 mg/dL — ABNORMAL HIGH (ref 70–99)
Potassium: 3.8 mEq/L (ref 3.5–5.1)
Sodium: 139 mEq/L (ref 135–145)

## 2010-08-20 LAB — CBC
HCT: 30.6 % — ABNORMAL LOW (ref 36.0–46.0)
Hemoglobin: 10.2 g/dL — ABNORMAL LOW (ref 12.0–15.0)
MCH: 26.4 pg (ref 26.0–34.0)
MCHC: 33.3 g/dL (ref 30.0–36.0)
MCV: 79.1 fL (ref 78.0–100.0)
Platelets: 133 10*3/uL — ABNORMAL LOW (ref 150–400)
RBC: 3.87 MIL/uL (ref 3.87–5.11)
RDW: 15.7 % — ABNORMAL HIGH (ref 11.5–15.5)
WBC: 8.5 10*3/uL (ref 4.0–10.5)

## 2010-08-20 LAB — GLUCOSE, CAPILLARY
Glucose-Capillary: 118 mg/dL — ABNORMAL HIGH (ref 70–99)
Glucose-Capillary: 127 mg/dL — ABNORMAL HIGH (ref 70–99)

## 2010-08-20 LAB — POCT I-STAT, CHEM 8
BUN: 5 mg/dL — ABNORMAL LOW (ref 6–23)
Calcium, Ion: 0.73 mmol/L — CL (ref 1.12–1.32)
Chloride: 115 mEq/L — ABNORMAL HIGH (ref 96–112)
Glucose, Bld: 94 mg/dL (ref 70–99)
Potassium: 2.2 mEq/L — CL (ref 3.5–5.1)

## 2010-08-21 LAB — TYPE AND SCREEN
ABO/RH(D): A NEG
Antibody Screen: NEGATIVE
Unit division: 0
Unit division: 0
Unit division: 0

## 2010-08-21 LAB — GLUCOSE, CAPILLARY: Glucose-Capillary: 148 mg/dL — ABNORMAL HIGH (ref 70–99)

## 2010-09-03 NOTE — Discharge Summary (Signed)
NAMESHANNON, BALTHAZAR NO.:  [id number]  MEDICAL RECORD NO.:  [id number]  LOCATION:  2038                         FACILITY:  [facility]  PHYSICIAN:  Evelene Croon, M.D.     DATE OF BIRTH:  27-Oct-1934  DATE OF ADMISSION:  08/17/2010 DATE OF DISCHARGE:                              DISCHARGE SUMMARY   PRIMARY ADMITTING DIAGNOSES: 1. Severe aortic stenosis. 2. Single-vessel coronary artery disease.  ADDITIONAL/DISCHARGE DIAGNOSES: 1. Severe aortic stenosis. 2. Single-vessel coronary artery disease. 3. Type 2 diabetes mellitus. 4. Hypertension. 5. Hypercholesterolemia. 6. Degenerative arthritis in the right knee.  PROCEDURES PERFORMED: 1. Coronary artery bypass grafting x1 (left internal mammary artery to     the LAD). 2. Aortic valve replacement with 21-mm [facility] Ease pericardial     tissue valve.  HISTORY:  The patient is a 76 year old female with a known history of aortic stenosis who was recently undergoing a preoperative cardiac evaluation for planned right knee replacement for severe degenerative arthritis.  She denied any significant symptoms of shortness of breath or dizziness.  She underwent an echocardiogram on July 11, 2010, which showed progression of her aortic stenosis, now into the severe range with a mean gradient of 39 and peak gradient of 69 and an aortic valve area of 0.9 centimeter squared by VTI and 0.8 centimeter squared by V- Max.  Ejection fraction was 55-60%.  There was moderate concentric left ventricular hypertrophy.  There was a calcified mitral annulus with no evidence of stenosis or regurgitation.  Right ventricular function was normal.  There was trivial tricuspid regurgitation, trivial pulmonary valve regurgitation.  She subsequently underwent cardiac catheterization on July 25, 2010, which showed normal right heart pressures.  The mean aortic valve gradient was 26 with a calculated valve area of 0.77 centimeter squared by  thermodilution and 0.41 centimeter squared by Fick.  Angiography showed the LAD to have a tandem focal 70-80% stenosis in the midportion of the LAD.  Left circumflex and right coronary artery has had no significant disease.  After review of her catheterization and echocardiogram and examination of the patient, it was felt that she would benefit from surgical intervention to her aortic valve as well as revascularization to the LAD prior to proceeding with her knee replacement.  She was referred to Dr. Evelene Croon for surgical consideration.  After review of her films and evaluation of the patient, Dr. Laneta Simmers agreed with need for surgery.  He explained all risks, benefits and alternatives to the patient and she agreed to proceed.  HOSPITAL COURSE:  Ms. Mervin was admitted to [facility] on August 17, 2010, and was taken to the operating room.  She underwent CABG x1 and aortic valve replacement as described above.  Please see previously dictated operative report for complete details of surgery. She tolerated the procedure well and was transferred to the SICU in stable condition.  She was extubated shortly after surgery.  She was hemodynamically stable and doing well on postop day #1.  She remained in the unit for further observation.  By postop day #2, all her chest tubes and lines were removed and she was able to be transferred to the Step- Down Unit.  Postoperatively, she has been started on Plavix as she has documented allergy to aspirin.  She has been restarted on her home diabetes medications and her blood sugars have remained stable.  Her blood pressures have been slowly trending upward and she has been restarted on a beta-blocker and the dose has been titrated accordingly.  She is overall doing very well postoperatively.  She is ambulating in the halls without problem.  She is tolerating regular diet and is having normal bowel and bladder function.  Her incisions are  all healing well.  She has remained afebrile and vital signs have been stable.  Her labs on postop day #3 show a hemoglobin of 10.2, hematocrit 30.6, white count 8.5, platelets 133.  Sodium 139, potassium 3.8, BUN 13, creatinine 0.7.  Her chest x-ray showed small bilateral pleural effusions and minimal atelectasis.  She has been mildly volume overloaded postoperatively and has been started on Lasix to which she is responding well.  She remains mildly edematous on physical exam and remains about 3 kg above her preoperative weight.  It is felt that she continues to progress well over the next 24 hours.  She will hopefully be ready for discharge home on August 21, 2010.  DISCHARGE MEDICATIONS: 1. Plavix 75 mg daily. 2. Lasix 40 mg daily x5 days. 3. Lopressor 25 mg b.i.d. 4. Oxycodone IR 5-10 mg q.3-4 h. p.r.n. pain. 5. Potassium 20 mEq daily x5 days. 6. Fenofibrate 54 mg daily. 7. Metformin 1000 mg b.i.d. 8. Extra Strength Tylenol 500 mg one to two daily p.r.n.  DISCHARGE INSTRUCTIONS:  She is asked to refrain from driving, heavy lifting, or strenuous activity.  She may continue ambulating daily and using her incentive spirometer.  She may shower daily and clean her incisions with soap and water.  She will continue a low-fat, low-sodium diet.  DISCHARGE FOLLOWUP:  She will need to make an appointment to see Dr. Swaziland in 2 weeks.  She will then follow up in 3 weeks with Dr. Laneta Simmers with a chest x-ray.  In the interim if she experiences any problems or has questions, she is asked to contact our office immediately.     Coral Ceo, P.A.   ______________________________ Evelene Croon, M.D.    GC/MEDQ  D:  08/20/2010  T:  08/20/2010  Job:  [id number]  cc:   [facility] Office Peter M. Swaziland, M.D. Loreta Ave, M.D.  Electronically Signed by Weldon Inches. on 08/30/2010 11:13:33 AM Electronically Signed by Evelene Croon M.D. on 09/03/2010 03:58:42 PM

## 2010-09-03 NOTE — Op Note (Signed)
[name] [name], HAYDEN NO.:  [id number]  MEDICAL RECORD NO.:  [id number]  LOCATION:  2315                         FACILITY:  [facility]  PHYSICIAN:  Evelene Croon, M.D.     DATE OF BIRTH:  11/13/1934  DATE OF PROCEDURE:  08/17/2010 DATE OF DISCHARGE:                              OPERATIVE REPORT   PREOPERATIVE DIAGNOSES:  Severe aortic stenosis and single-vessel coronary artery disease.  POSTOPERATIVE DIAGNOSES:  Severe aortic stenosis and single-vessel coronary artery disease.  PROCEDURES:  Median sternotomy, extracorporeal circulation, coronary artery bypass graft surgery x1 using a left internal mammary artery graft to left anterior descending coronary artery, aortic valve replacement using a 21-mm Edwards pericardial Magna - Ease valve.  ATTENDING SURGEON:  Evelene Croon, MD  ASSISTANT:  Doree Fudge, PA-C  ANESTHESIA:  General endotracheal.  CLINICAL HISTORY:  This patient is a 76 year old woman with a history of hypertension and hypercholesterolemia as well as diabetes and known aortic stenosis, who is undergoing preoperative cardiac evaluation for a planned right knee replacement for severe degenerative arthritis.  She denied any significant symptoms.  An echocardiogram on July 11, 2010, showed progression of her aortic stenosis into the severe range with a mean gradient of 39 and a peak gradient of 69 with an aortic valve area of 0.9 cm2 by VTI and 0.8 cm2 by V-max.  Ejection fraction was 55%-60%. There was moderate concentric left ventricular hypertrophy.  There was calcified mitral annulus with no evidence of stenosis or regurgitation. Right ventricular function was normal.  There was trivial tricuspid regurgitation.  There was trivial pulmonary valve regurgitation.  She subsequently underwent cardiac catheterization on July 25, 2010, that showed normal right heart pressures.  The mean aortic valve gradient was 26.  A valve area was calculated  at 0.77 cm2 by thermodilution and 0.41 cm2 by Fick.  Angiography showed the LAD to have tandem focal 70%-80% stenoses in the midportion of the LAD.  Left circumflex and right coronary arteries had no significant disease.  After review of the catheterization and echocardiogram and examination of the patient, it was felt that aortic valve replacement and coronary artery bypass graft surgery to the LAD was the best treatment to prevent further progression of left ventricular hypertrophy and to decrease her chances of perioperative cardiac complications when she undergoes knee replacement. I discussed the operative procedure of coronary artery bypass using the left internal mammary graft to the LAD as well as aortic valve replacement with the patient and her family.  We discussed the pros and cons of mechanical and tissue valves and my recommendation for a tissue valve given her age and medical problems.  We discussed the benefits and risks of surgery including, but not limited to bleeding, blood transfusion, infection, stroke, myocardial infarction, graft failure, heart block requiring permanent pacemaker, organ dysfunction, and death. She understood and agreed to proceed.  OPERATIVE PROCEDURE:  The patient was taken to the operating room and placed on the table in supine position.  After induction of general endotracheal anesthesia, a Foley catheter was placed in the bladder using sterile technique.  Then, the chest, abdomen, and both lower extremities were prepped and draped in usual sterile manner.  Time-out  was taken and proper the patient, proper operation were confirmed with nursing and anesthesia staff.  TEE was performed by Dr. Jairo Ben and showed severe calcific aortic stenosis with a valve area 0.7 cm2. The remainder of the TEE findings were unchanged from her preoperative echo.  Then, the chest was opened through a median sternotomy incision and the pericardium opened  in midline.  Examination of the heart showed good ventricular contractility.  The ascending aorta had no palpable plaques in it and was of normal size.  Then, the left internal mammary artery was harvested from the chest wall as pedicle graft.  This was a medium-caliber vessel with excellent blood flow through it.  Then, the patient was heparinized when adequate ACT was obtained.  The distal ascending aorta was cannulated using a 20-French aortic cannula for arterial inflow.  Venous outflow was achieved using a two-stage venous cannula through the right atrial appendage.  Antegrade cardioplegia and vent cannula was inserted in the aortic root.  A left ventricular vent was placed to the right superior pulmonary vein.  A retrograde cardioplegic cannula was placed through a pursestring suture in the right atrium.  The patient was placed on cardiopulmonary bypass and distal coronary arteries identified.  The LAD was a moderate-sized vessel with no distal disease in it.  Then, the aorta was crossclamped and 600 mL of cold blood antegrade cardioplegia was administered in the aortic root with quick arrest of the heart.  Systemic hypothermia to 32 degrees centigrade and topical hypothermic iced saline was used.  A temperature probe was placed in septum, insulating pad in the pericardium.  Then, the distal anastomosis was performed in the distal LAD.  The internal diameter of this vessel was 1.75 mm.  Conduit used was the left internal mammary artery graft and this was brought through an opening in the left pericardium anterior to the phrenic nerve.  It was anastomosed to the LAD in an end-to-side manner using continuous 8-0 Prolene suture. The pedicle was sutured to the epicardium with 6-0 Prolene sutures.  Then, another dose of antegrade cardioplegia was given.  Throughout the remainder of the procedure, cold blood retrograde cardioplegia was given at about 20-minute intervals,  maintaining myocardial temperature around 10 degrees centigrade.  Then, attention was turned to the aortic valve replacement.  The aorta was opened transversely about 1 cm above the sinotubular junction.  Examination of the aortic valve showed that there were 3 leaflets.  They were calcified and immobile.  There was mild annular calcification.  The right and left coronary ostia were identified and were not obstructed.  The native valve was excised.  The annulus was decalcified with rongeurs.  Care was taken to remove all particulate debris.  Left ventricle and aortic root were copiously irrigated with iced saline solution.  The annulus was sized and a 21-mm Edwards pericardial Magna - Ease valve was chosen.  This had model number [id number], serial number [id number].  Then, a series of pledgeted 2-0 Ethibond horizontal mattress sutures were placed around the annulus with pledgets in the subannular position.  Sutures were placed through the sewing ring and the valve lowered in the place.  The valve seated nicely.  The coronary ostia were not obstructed.  The sutures were tied. The patient was then rewarmed to 37 degrees centigrade.  The aortotomy was closed in 2 layers using continuous 4-0 Prolene suture with a light coating of CoSeal for hemostasis.  We did use CO2 insufflation into the pericardial cavity throughout  the procedure to minimize intracardiac air.  The head was then placed in Trendelenburg position.  The clamp was removed from the mammary pedicle.  There was rapid warming of the ventricular septum and return of spontaneous ventricular fibrillation. Left side of the heart was de-aired and then the crossclamp removed from the aorta with a crossclamp time of 78 minutes.  There was spontaneous return of ventricular fibrillation and the patient was defibrillated into sinus rhythm.  The distal anastomosis was hemostatic.  The aortic suture line was hemostatic.  Then, 2 temporary right  ventricular and right atrial pacing wires were placed above the skin.  When the patient rewarmed to 37 degrees centigrade, the left ventricular vent and retrograde cardioplegic cannulas were removed.  The patient was weaned from cardiopulmonary bypass on no inotropic agents.  Total bypass time was 99 minutes.  Cardiac function appeared good with cardiac output of 4.5 L per minute.  TEE was performed and showed normal-functioning aortic valve prosthesis without evidence of perivalvular leak or regurgitation through the valve.  There was trivial mitral regurgitation and normal left ventricular function.  Then, protamine was given and the venous and aortic cannulas were removed without difficulty.  Hemostasis was achieved.  Three chest tubes were placed with 2 in the posterior pericardium on left pleural space and 1 in the anterior mediastinum. The sternum was closed with double #6 stainless steel wires.  Fascia was closed with continuous #1 Vicryl suture.  Subcutaneous tissue was closed with continuous 2-0 Vicryl and the skin with a 3-0 Vicryl subcuticular closure.  The sponge, needle, and instrument counts were correct according to the scrub nurse.  Dry sterile dressings were applied over the incisions and around the chest tubes with Pleur-Evac suction.  The patient remained hemodynamically stable and was transported to the SICU in guarded, but stable condition.     Evelene Croon, M.D.     BB/MEDQ  D:  08/17/2010  T:  08/17/2010  Job:  [id number]  cc:   Peter M. Swaziland, M.D.  Electronically Signed by Evelene Croon M.D. on 09/03/2010 03:58:38 PM

## 2010-09-10 ENCOUNTER — Other Ambulatory Visit: Payer: Self-pay | Admitting: Surgery

## 2010-09-10 DIAGNOSIS — M1710 Unilateral primary osteoarthritis, unspecified knee: Secondary | ICD-10-CM | POA: Insufficient documentation

## 2010-09-10 DIAGNOSIS — M1990 Unspecified osteoarthritis, unspecified site: Secondary | ICD-10-CM | POA: Insufficient documentation

## 2010-09-10 DIAGNOSIS — I359 Nonrheumatic aortic valve disorder, unspecified: Secondary | ICD-10-CM

## 2010-09-10 DIAGNOSIS — I2510 Atherosclerotic heart disease of native coronary artery without angina pectoris: Secondary | ICD-10-CM

## 2010-09-10 DIAGNOSIS — E785 Hyperlipidemia, unspecified: Secondary | ICD-10-CM | POA: Insufficient documentation

## 2010-09-10 DIAGNOSIS — I350 Nonrheumatic aortic (valve) stenosis: Secondary | ICD-10-CM

## 2010-09-11 ENCOUNTER — Encounter: Payer: Self-pay | Admitting: Surgery

## 2010-09-11 ENCOUNTER — Ambulatory Visit
Admission: RE | Admit: 2010-09-11 | Discharge: 2010-09-11 | Disposition: A | Discharge status: Home / Self Care | Payer: Medicare (Managed Care) | Source: Ambulatory Visit | Attending: Surgery | Admitting: Surgery

## 2010-09-11 ENCOUNTER — Ambulatory Visit (INDEPENDENT_AMBULATORY_CARE_PROVIDER_SITE_OTHER): Payer: Self-pay | Admitting: Surgery

## 2010-09-11 VITALS — BP 145/70 | HR 66 | Temp 98.4°F | Resp 16 | Ht 62.0 in | Wt 187.0 lb

## 2010-09-11 DIAGNOSIS — I359 Nonrheumatic aortic valve disorder, unspecified: Secondary | ICD-10-CM

## 2010-09-11 DIAGNOSIS — I2510 Atherosclerotic heart disease of native coronary artery without angina pectoris: Secondary | ICD-10-CM

## 2010-09-11 NOTE — Patient Instructions (Signed)
You may return to driving when you feel comfortable with that.  Do not lift anything heavier than 10 lbs for three months postoperatively. Return to see me if any problems develop with you incision; such as redness, swelling, or drainage.  

## 2010-09-11 NOTE — Progress Notes (Signed)
  HPI  Patient returns for routine postoperative follow-up having undergone coronary bypass graft surgery x1 and aortic valve replacement using a 21 mm Edwards magna-ease pericardial valve on 08/17/2010. The patient's early postoperative recovery while in the hospital was notable for an uncomplicated postoperative course. Since hospital discharge the patient reports she has been feeling well. She is in walking without chest pain or shortness of breath. Her family has noticed some swelling in her ankles and feet.    Current Outpatient Prescriptions  Medication Sig Dispense Refill  . acetaminophen (TYLENOL) 500 MG tablet Take 500 mg by mouth every 6 (six) hours as needed.        . clopidogrel (PLAVIX) 75 MG tablet Take 75 mg by mouth daily.        Marland Kitchen DIOVAN HCT 160-25 MG per tablet Take 1 tablet by mouth Daily.      . FENOFIBRATE PO Take 1 tablet by mouth daily. 54mg  po every day      . METFORMIN HCL PO Take 1,000 mg by mouth 2 (two) times daily.       . metoprolol succinate (TOPROL-XL) 25 MG 24 hr tablet Take 1 tablet by mouth 2 (two) times daily.       Marland Kitchen oxycodone (OXY-IR) 5 MG capsule Take 5 mg by mouth every 4 (four) hours as needed.            Physical Exam  Constitutional: She appears well-developed and well-nourished.  Cardiovascular: Normal rate, regular rhythm and normal heart sounds.  Exam reveals no gallop and no friction rub.   No murmur heard. Pulmonary/Chest: Breath sounds normal.       Chest incision healing well  Musculoskeletal: She exhibits edema.       Mild in ankles     Diagnostic tests:  Chest x-ray shows clear lung fields and no pleural effusions.  Impression:  Overall [name] [name] is making good recovery following her surgery. I encouraged her to continue walking as much as possible. I told her she could return driving a car at this time but should refrain from lifting anything heavier than 10 pounds for a total of 3 months from the date of surgery. Her blood  pressure has risen postoperatively and she has some lower extremity edema in her ankles.  Plan:  I asked her to resume the Diovan HCT for her hypertension and lower extremity edema. She will call Dr. Elvis Coil office for a followup appointment. I think she should wait at least 6 months to have her knee replacement since she is doing well overall and is walking without the aid of a walker or cane.

## 2010-09-14 ENCOUNTER — Other Ambulatory Visit: Payer: Self-pay | Admitting: Cardiology

## 2010-09-14 MED ORDER — VALSARTAN-HYDROCHLOROTHIAZIDE 160-25 MG PO TABS: 1.0000 | ORAL_TABLET | Freq: Every day | ORAL | Status: DC

## 2010-09-14 NOTE — Telephone Encounter (Signed)
Pt's daughter called she said her mother has appt on 5th but she is out of diovan and wanted to know if Dr Swaziland could refill diovan at [facility] on [address]  Please call

## 2010-09-14 NOTE — Telephone Encounter (Signed)
Sister called requesting refill on Diovan HCT 160/25 mg. States Dr. Laneta Simmers wanted her to restart because BP was elevated. Will send to [facility].

## 2010-09-19 ENCOUNTER — Encounter: Payer: Self-pay | Admitting: Nurse Practitioner

## 2010-09-19 ENCOUNTER — Ambulatory Visit (INDEPENDENT_AMBULATORY_CARE_PROVIDER_SITE_OTHER): Payer: Medicare (Managed Care) | Admitting: Nurse Practitioner

## 2010-09-19 VITALS — BP 160/90 | HR 74 | Ht 62.0 in | Wt 186.0 lb

## 2010-09-19 DIAGNOSIS — Z952 Presence of prosthetic heart valve: Secondary | ICD-10-CM | POA: Insufficient documentation

## 2010-09-19 DIAGNOSIS — Z951 Presence of aortocoronary bypass graft: Secondary | ICD-10-CM

## 2010-09-19 DIAGNOSIS — I2510 Atherosclerotic heart disease of native coronary artery without angina pectoris: Secondary | ICD-10-CM

## 2010-09-19 DIAGNOSIS — I10 Essential (primary) hypertension: Secondary | ICD-10-CM

## 2010-09-19 DIAGNOSIS — Z954 Presence of other heart-valve replacement: Secondary | ICD-10-CM

## 2010-09-19 LAB — CBC WITH DIFFERENTIAL/PLATELET
Basophils Absolute: 0 10*3/uL (ref 0.0–0.1)
Basophils Relative: 0.5 % (ref 0.0–3.0)
Eosinophils Absolute: 0.1 10*3/uL (ref 0.0–0.7)
Eosinophils Relative: 2.1 % (ref 0.0–5.0)
HCT: 33.8 % — ABNORMAL LOW (ref 36.0–46.0)
Hemoglobin: 11 g/dL — ABNORMAL LOW (ref 12.0–15.0)
Lymphocytes Relative: 19.8 % (ref 12.0–46.0)
Lymphs Abs: 1 10*3/uL (ref 0.7–4.0)
MCHC: 32.4 g/dL (ref 30.0–36.0)
MCV: 80 fl (ref 78.0–100.0)
Monocytes Absolute: 0.5 10*3/uL (ref 0.1–1.0)
Monocytes Relative: 8.8 % (ref 3.0–12.0)
Neutro Abs: 3.6 10*3/uL (ref 1.4–7.7)
Neutrophils Relative %: 68.8 % (ref 43.0–77.0)
Platelets: 220 10*3/uL (ref 150.0–400.0)
RBC: 4.23 Mil/uL (ref 3.87–5.11)
RDW: 16.2 % — ABNORMAL HIGH (ref 11.5–14.6)
WBC: 5.3 10*3/uL (ref 4.5–10.5)

## 2010-09-19 LAB — BASIC METABOLIC PANEL
BUN: 16 mg/dL (ref 6–23)
CO2: 28 mEq/L (ref 19–32)
Calcium: 9.1 mg/dL (ref 8.4–10.5)
Chloride: 105 mEq/L (ref 96–112)
Creatinine, Ser: 0.9 mg/dL (ref 0.4–1.2)
GFR: 80.79 mL/min (ref >60.00)
Glucose, Bld: 218 mg/dL — ABNORMAL HIGH (ref 70–99)
Potassium: 4.3 mEq/L (ref 3.5–5.1)
Sodium: 142 mEq/L (ref 135–145)

## 2010-09-19 MED ORDER — VALSARTAN-HYDROCHLOROTHIAZIDE 320-25 MG PO TABS: 1.0000 | ORAL_TABLET | Freq: Every day | ORAL | Status: DC

## 2010-09-19 NOTE — Progress Notes (Signed)
    [name] [name] Date of Birth: 12-Feb-1934   History of Present Illness: [name] [name] is seen today for her post hospital visit. She is seen for Dr. Swaziland. She had CABG x 1 with AVR about a month ago. She is doing well. She has no complaints. She is accompanied by her daughter. Her daughter is concerned because she is not walking, using too much salt, not checking her blood sugars and forgetting her medicines. She has had no post op problems. Appetite is ok. Bowels are ok. Wounds are ok. She has been released from Dr. Laneta Simmers. She has finished her Lasix but continued taking her potassium. She has not had any follow up labs.   Current Outpatient Prescriptions on File Prior to Visit  Medication Sig Dispense Refill  . acetaminophen (TYLENOL) 500 MG tablet Take 500 mg by mouth every 6 (six) hours as needed.        . clopidogrel (PLAVIX) 75 MG tablet Take 75 mg by mouth daily.        . FENOFIBRATE PO Take 1 tablet by mouth daily. 54mg  po every day      . METFORMIN HCL PO Take 1,000 mg by mouth 2 (two) times daily.       . metoprolol tartrate (LOPRESSOR) 25 MG tablet Take 25 mg by mouth 2 (two) times daily.          Allergies  Allergen Reactions  . Aspirin   . Crestor (Rosuvastatin Calcium)   . Lipitor (Atorvastatin Calcium)   . Norvasc (Amlodipine Besylate)     Past Medical History  Diagnosis Date  . Diabetes mellitus   . Hypertension   . Aortic stenosis     s/p AVR August 2012  . Hypercholesterolemia   . OA (osteoarthritis)   . CAD (coronary artery disease)     s/p CABG x 15 August 2010  . Hyperlipidemia   . Degenerative arthritis of knee   . Obesity     Past Surgical History  Procedure Date  . Cardiac catheterization 07/25/2010    Dr Swaziland  . Coronary artery bypass graft 08/17/10    x1, Dr Laneta Simmers with LIMA to LAD  . Aortic valve replacement 08/17/2010    using a 21-mm Edwards pericardial Magna - Ease valve.     History  Smoking status  . Never Smoker   Smokeless  tobacco  . Not on file    History  Alcohol Use No    Family History  Problem Relation Age of Onset  . Tuberculosis Mother   . Heart disease Father     Review of Systems: The review of systems is positive for occasional edema. She is not able to take Norvasc due to swelling. Chest soreness is resolved.  All other systems were reviewed and are negative.  Physical Exam: BP 160/90  Pulse 74  Ht 5\' 2"  (1.575 m)  Wt 186 lb (84.369 kg)  BMI 34.02 kg/m2 Patient is very pleasant and in no acute distress. Skin is warm and dry. Color is normal.  HEENT is unremarkable. Normocephalic/atraumatic. PERRL. Sclera are nonicteric. Neck is supple. No masses. No JVD. Lungs are clear. Cardiac exam shows a regular rate and rhythm. She has frequent ectopics.  Abdomen is soft. Extremities are without edema. Gait and ROM are intact. No gross neurologic deficits noted.  LABORATORY DATA: EKG shows sinus with PAC's and diffuse T wave changes.    Assessment / Plan:

## 2010-09-19 NOTE — Assessment & Plan Note (Signed)
She is making good progress but needs to get into a walking program. Not sure if she is going to be able to do cardiac rehab. Will discuss further at her next visit.

## 2010-09-19 NOTE — Assessment & Plan Note (Addendum)
Blood pressure is up. She may have missed some of her medicines. I increased her DiovanHct to 320/25. Her labs are checked today. I have stopped the potassium. She is to monitor her blood pressure at home. She is cut back on her salt use.  I will see her back in a few weeks. Patient is agreeable to this plan and will call if any problems develop in the interim.

## 2010-09-19 NOTE — Assessment & Plan Note (Signed)
She is doing well. No murmur on exam today. She will need a follow up echo in a couple of months. Patient is agreeable to this plan and will call if any problems develop in the interim.

## 2010-09-19 NOTE — Patient Instructions (Addendum)
I am going to increase your DiovanHct to 320/25 mg. Take every day You may stop your Potassium. We are going to check your labs today Monitor your blood pressure at home.  Record your readings and bring to your next visit. Limit sodium intake. Get the salt shaker off the table at home. Call for any problems. I will see you back in 3 weeks

## 2010-09-22 ENCOUNTER — Emergency Department: Payer: Medicare Other | Admitting: Emergency Medicine

## 2010-09-24 ENCOUNTER — Telehealth: Payer: Self-pay | Admitting: *Deleted

## 2010-09-24 NOTE — Telephone Encounter (Signed)
Spoke w/daughter and gave her lab results. States Saturday when had hair done and was sitting under dryer she became unresponsive. 911 was called and took her to [facility] and ran "tests" and told to f/u w/PCP; also referred her to Neurologist. Will send labs to Dr. Neva Seat

## 2010-09-24 NOTE — Telephone Encounter (Signed)
Message copied by Lorayne Bender on Mon Sep 24, 2010  3:37 PM ------      Message from: Rosalio Macadamia      Created: Wed Sep 19, 2010  2:55 PM       Ok to report. Labs are satisfactory. Glucose needs to be lower. Post op anemia noted.

## 2010-10-08 ENCOUNTER — Other Ambulatory Visit: Payer: Self-pay | Admitting: Neurology

## 2010-10-08 DIAGNOSIS — I38 Endocarditis, valve unspecified: Secondary | ICD-10-CM

## 2010-10-08 DIAGNOSIS — E1149 Type 2 diabetes mellitus with other diabetic neurological complication: Secondary | ICD-10-CM

## 2010-10-08 DIAGNOSIS — R404 Transient alteration of awareness: Secondary | ICD-10-CM

## 2010-10-12 ENCOUNTER — Encounter: Payer: Self-pay | Admitting: Nurse Practitioner

## 2010-10-12 ENCOUNTER — Ambulatory Visit (INDEPENDENT_AMBULATORY_CARE_PROVIDER_SITE_OTHER): Payer: Medicare (Managed Care) | Admitting: Nurse Practitioner

## 2010-10-12 DIAGNOSIS — I359 Nonrheumatic aortic valve disorder, unspecified: Secondary | ICD-10-CM

## 2010-10-12 DIAGNOSIS — I2510 Atherosclerotic heart disease of native coronary artery without angina pectoris: Secondary | ICD-10-CM

## 2010-10-12 DIAGNOSIS — Z954 Presence of other heart-valve replacement: Secondary | ICD-10-CM

## 2010-10-12 DIAGNOSIS — I350 Nonrheumatic aortic (valve) stenosis: Secondary | ICD-10-CM

## 2010-10-12 DIAGNOSIS — Z952 Presence of prosthetic heart valve: Secondary | ICD-10-CM

## 2010-10-12 NOTE — Patient Instructions (Signed)
We are going to get an ultrasound of your heart to look at your valve Stay on your current medicines We will see you back in 2 months Walk as much as possible and cut back on your salt use. Call for any problems.

## 2010-10-12 NOTE — Assessment & Plan Note (Signed)
We will check an echo. Tentatively see her back in 2 months.

## 2010-10-12 NOTE — Progress Notes (Signed)
    [name] [name] Date of Birth: December 25, 1934   History of Present Illness: Ms. Sowle is seen today for a follow up visit. It is a 3 week check. She is here with her family member. She has had an episode of "unresponsiveness" since her last visit. She is not really able to describe and the family member that is here today was not the one who witnessed it. She has not had recurrence and currently feels fine. She has neurology work up in process. She has had recent MRI. She is to have carotids. I suspect she had a doppler study prior to her CABG and AVR. No chest pain. She does use salt. Not walking a lot.   Current Outpatient Prescriptions on File Prior to Visit  Medication Sig Dispense Refill  . acetaminophen (TYLENOL) 500 MG tablet Take 500 mg by mouth every 6 (six) hours as needed.        . clopidogrel (PLAVIX) 75 MG tablet Take 75 mg by mouth daily.        . FENOFIBRATE PO Take 1 tablet by mouth daily. 54mg  po every day      . METFORMIN HCL PO Take 1,000 mg by mouth 2 (two) times daily.       . metoprolol tartrate (LOPRESSOR) 25 MG tablet Take 25 mg by mouth 2 (two) times daily.        . valsartan-hydrochlorothiazide (DIOVAN HCT) 320-25 MG per tablet Take 1 tablet by mouth daily.  30 tablet  6    Allergies  Allergen Reactions  . Aspirin   . Crestor (Rosuvastatin Calcium)   . Lipitor (Atorvastatin Calcium)   . Norvasc (Amlodipine Besylate)     Past Medical History  Diagnosis Date  . Diabetes mellitus   . Hypertension   . Aortic stenosis     s/p AVR August 2012  . Hypercholesterolemia   . OA (osteoarthritis)   . CAD (coronary artery disease)     s/p CABG x 15 August 2010  . Hyperlipidemia   . Degenerative arthritis of knee   . Obesity     Past Surgical History  Procedure Date  . Cardiac catheterization 07/25/2010    Dr [place]  . Coronary artery bypass graft 08/17/10    x1, Dr Laneta Simmers with LIMA to LAD  . Aortic valve replacement 08/17/2010    using a 21-mm Edwards  pericardial Magna - Ease valve.     History  Smoking status  . Never Smoker   Smokeless tobacco  . Not on file    History  Alcohol Use No    Family History  Problem Relation Age of Onset  . Tuberculosis Mother   . Heart disease Father     Review of Systems: The review of systems is as above.  All other systems were reviewed and are negative.  Physical Exam: BP 132/80  Pulse 56  Ht 5\' 1"  (1.549 m)  Wt 182 lb 12.8 oz (82.918 kg)  BMI 34.54 kg/m2 Patient is pleasant and in no acute distress. Skin is warm and dry. Color is normal.  HEENT is unremarkable. Normocephalic/atraumatic. PERRL. Sclera are nonicteric. Neck is supple. No masses. No JVD. Lungs are clear. Cardiac exam shows a regular rate and rhythm. She has a soft outflow murmur. Sternum looks good. Abdomen is obese but soft. Extremities are without edema. Gait and ROM are intact. No gross neurologic deficits noted.   LABORATORY DATA:   Assessment / Plan:

## 2010-10-12 NOTE — Assessment & Plan Note (Signed)
Progressing nicely from our standpoint. But has had episode of "unresponsiveness". We will go ahead and check a post op echo. Will tentatively see her back in 2 months. Activity is encouraged along with salt restriction. Patient is agreeable to this plan and will call if any problems develop in the interim.

## 2010-10-16 ENCOUNTER — Other Ambulatory Visit: Payer: Self-pay | Admitting: Cardiology

## 2010-10-17 ENCOUNTER — Other Ambulatory Visit: Payer: Self-pay | Admitting: Cardiology

## 2010-10-17 NOTE — Telephone Encounter (Signed)
Fax Received. Refill Completed. Aviraj Kentner Chowoe (M.A)  

## 2010-10-19 ENCOUNTER — Other Ambulatory Visit: Payer: Self-pay | Admitting: Cardiology

## 2010-10-22 ENCOUNTER — Telehealth: Payer: Self-pay | Admitting: *Deleted

## 2010-10-22 ENCOUNTER — Ambulatory Visit (HOSPITAL_COMMUNITY): Payer: Medicare (Managed Care) | Attending: Cardiology | Admitting: Radiology

## 2010-10-22 DIAGNOSIS — I2510 Atherosclerotic heart disease of native coronary artery without angina pectoris: Secondary | ICD-10-CM | POA: Insufficient documentation

## 2010-10-22 DIAGNOSIS — E785 Hyperlipidemia, unspecified: Secondary | ICD-10-CM | POA: Insufficient documentation

## 2010-10-22 DIAGNOSIS — I10 Essential (primary) hypertension: Secondary | ICD-10-CM | POA: Insufficient documentation

## 2010-10-22 DIAGNOSIS — I359 Nonrheumatic aortic valve disorder, unspecified: Secondary | ICD-10-CM | POA: Insufficient documentation

## 2010-10-22 DIAGNOSIS — Z952 Presence of prosthetic heart valve: Secondary | ICD-10-CM

## 2010-10-22 NOTE — Telephone Encounter (Signed)
Lm of echo results w/sister Ms. Daphine Deutscher and also her daughter Ms. Edwards. Will send copy to Dr. Neva Seat

## 2010-10-22 NOTE — Telephone Encounter (Signed)
Message copied by Lorayne Bender on Mon Oct 22, 2010  2:08 PM ------      Message from: [place], PETER M      Created: Mon Oct 22, 2010  1:08 PM       LV function looks good. AV prosthesis functioning well. Good report.      Theron Arista [place]

## 2010-11-12 ENCOUNTER — Other Ambulatory Visit: Payer: Self-pay | Admitting: *Deleted

## 2010-11-13 ENCOUNTER — Other Ambulatory Visit: Payer: Self-pay

## 2010-11-19 ENCOUNTER — Other Ambulatory Visit: Payer: Self-pay

## 2010-11-21 ENCOUNTER — Telehealth: Payer: Self-pay | Admitting: *Deleted

## 2010-11-21 NOTE — Telephone Encounter (Signed)
Received prior auth request from [facility] for Valsartan-HCTZ 320/25. Called [facility] at [phone]. Spoke w/ Vinnie Level. She states this is generic and she will override because is on her drug list. She will approve. I called pharmacy [facility] in [place] at [phone] and they resubmitted and was approved. Will have ready for pt. Lm for sister that medication will be ready for pick up.

## 2010-12-12 ENCOUNTER — Ambulatory Visit: Payer: Medicare (Managed Care) | Admitting: Cardiology

## 2010-12-30 ENCOUNTER — Ambulatory Visit (INDEPENDENT_AMBULATORY_CARE_PROVIDER_SITE_OTHER): Payer: PRIVATE HEALTH INSURANCE

## 2010-12-30 DIAGNOSIS — M1710 Unilateral primary osteoarthritis, unspecified knee: Secondary | ICD-10-CM

## 2010-12-30 DIAGNOSIS — J209 Acute bronchitis, unspecified: Secondary | ICD-10-CM

## 2011-01-17 ENCOUNTER — Ambulatory Visit: Payer: Medicare (Managed Care) | Admitting: Cardiology

## 2011-01-24 ENCOUNTER — Ambulatory Visit (INDEPENDENT_AMBULATORY_CARE_PROVIDER_SITE_OTHER): Payer: PRIVATE HEALTH INSURANCE | Admitting: Cardiology

## 2011-01-24 ENCOUNTER — Encounter: Payer: Self-pay | Admitting: Cardiology

## 2011-01-24 VITALS — BP 148/70 | HR 82 | Ht 62.0 in | Wt 188.0 lb

## 2011-01-24 DIAGNOSIS — I10 Essential (primary) hypertension: Secondary | ICD-10-CM

## 2011-01-24 DIAGNOSIS — I2510 Atherosclerotic heart disease of native coronary artery without angina pectoris: Secondary | ICD-10-CM

## 2011-01-24 DIAGNOSIS — Z951 Presence of aortocoronary bypass graft: Secondary | ICD-10-CM

## 2011-01-24 DIAGNOSIS — Z954 Presence of other heart-valve replacement: Secondary | ICD-10-CM

## 2011-01-24 DIAGNOSIS — Z952 Presence of prosthetic heart valve: Secondary | ICD-10-CM

## 2011-01-24 NOTE — Assessment & Plan Note (Signed)
Patient has made excellent recovery from her aortic valve replacement. She is asymptomatic. We have cleared her if she requires knee replacement surgery. She may stop her Plavix one week before surgery.

## 2011-01-24 NOTE — Progress Notes (Signed)
    [name] [name] Date of Birth: 1934/10/28   History of Present Illness: [name] [name] is seen today for a follow up visit. She is doing very well from a cardiac standpoint. She denies any chest pain, shortness of breath, or palpitations. She had an episode of unresponsiveness back in the fall. She had extensive neurologic evaluation including MRI, carotid Doppler studies, and EEG which were apparently unremarkable.   Current Outpatient Prescriptions on File Prior to Visit  Medication Sig Dispense Refill  . acetaminophen (TYLENOL) 500 MG tablet Take 500 mg by mouth every 6 (six) hours as needed.        . clopidogrel (PLAVIX) 75 MG tablet TAKE 1 TABLET BY MOUTH DAILY  30 tablet  5  . FENOFIBRATE PO Take 1 tablet by mouth daily. 134 mg po every day      . METFORMIN HCL PO Take 1,000 mg by mouth 2 (two) times daily.       . metoprolol tartrate (LOPRESSOR) 25 MG tablet TAKE 1 TABLET BY MOUTH TWICE DAILY  60 tablet  5  . valsartan-hydrochlorothiazide (DIOVAN HCT) 320-25 MG per tablet Take 1 tablet by mouth daily.  30 tablet  6    Allergies  Allergen Reactions  . Aspirin   . Crestor (Rosuvastatin Calcium)   . Lipitor (Atorvastatin Calcium)   . Norvasc (Amlodipine Besylate)     Past Medical History  Diagnosis Date  . Diabetes mellitus   . Hypertension   . Aortic stenosis     s/p AVR August 2012  . Hypercholesterolemia   . OA (osteoarthritis)   . CAD (coronary artery disease)     s/p CABG x 15 August 2010  . Hyperlipidemia   . Degenerative arthritis of knee   . Obesity     Past Surgical History  Procedure Date  . Cardiac catheterization 07/25/2010    Dr [place]  . Coronary artery bypass graft 08/17/10    x1, Dr Laneta Simmers with LIMA to LAD  . Aortic valve replacement 08/17/2010    using a 21-mm Edwards pericardial Magna - Ease valve.     History  Smoking status  . Never Smoker   Smokeless tobacco  . Not on file    History  Alcohol Use No    Family History  Problem  Relation Age of Onset  . Tuberculosis Mother   . Heart disease Father     Review of Systems: The review of systems is as above.  She has significant limitation in her walking because of arthritis in her knees. All other systems were reviewed and are negative.  Physical Exam: BP 148/70  Pulse 82  Ht 5\' 2"  (1.575 m)  Wt 188 lb (85.276 kg)  BMI 34.39 kg/m2 Patient is pleasant and in no acute distress. Skin is warm and dry. Color is normal.  HEENT is unremarkable. Normocephalic/atraumatic. PERRL. Sclera are nonicteric. Neck is supple. No masses. No JVD. Lungs are clear. Cardiac exam shows a regular rate and rhythm. She has a soft outflow murmur. Sternum looks good. Abdomen is obese but soft. Extremities are without edema. Gait and ROM are intact. No gross neurologic deficits noted.   LABORATORY DATA: Echocardiogram in October showed good aortic valve prosthetic function. LV function was normal.  Assessment / Plan:

## 2011-01-24 NOTE — Assessment & Plan Note (Signed)
Blood pressure still mildly elevated. She will continue with her current medications. She needs to restrict her sodium intake.

## 2011-01-24 NOTE — Patient Instructions (Signed)
Continue your current therapy.  You are cleared to have knee surgery. You may stop Plavix 7 days before surgery.  I will see you again in 6 months.

## 2011-01-24 NOTE — Assessment & Plan Note (Signed)
She's made an excellent recovery from her bypass surgery. She is asymptomatic. I'll follow up again in 6 months.

## 2011-06-14 ENCOUNTER — Other Ambulatory Visit: Payer: Self-pay | Admitting: Nurse Practitioner

## 2011-06-19 ENCOUNTER — Other Ambulatory Visit: Payer: Self-pay | Admitting: Internal Medicine

## 2011-07-02 ENCOUNTER — Encounter: Payer: Self-pay | Admitting: Internal Medicine

## 2011-07-02 ENCOUNTER — Ambulatory Visit (INDEPENDENT_AMBULATORY_CARE_PROVIDER_SITE_OTHER): Payer: PRIVATE HEALTH INSURANCE | Admitting: Internal Medicine

## 2011-07-02 VITALS — BP 159/68 | HR 64 | Temp 97.7°F | Resp 17 | Ht 60.0 in | Wt 199.0 lb

## 2011-07-02 DIAGNOSIS — E789 Disorder of lipoprotein metabolism, unspecified: Secondary | ICD-10-CM

## 2011-07-02 DIAGNOSIS — M1990 Unspecified osteoarthritis, unspecified site: Secondary | ICD-10-CM

## 2011-07-02 DIAGNOSIS — M170 Bilateral primary osteoarthritis of knee: Secondary | ICD-10-CM

## 2011-07-02 DIAGNOSIS — E119 Type 2 diabetes mellitus without complications: Secondary | ICD-10-CM

## 2011-07-02 DIAGNOSIS — Z7189 Other specified counseling: Secondary | ICD-10-CM

## 2011-07-02 DIAGNOSIS — I10 Essential (primary) hypertension: Secondary | ICD-10-CM

## 2011-07-02 DIAGNOSIS — M25569 Pain in unspecified knee: Secondary | ICD-10-CM

## 2011-07-02 DIAGNOSIS — I2510 Atherosclerotic heart disease of native coronary artery without angina pectoris: Secondary | ICD-10-CM

## 2011-07-02 LAB — POCT CBC
HCT, POC: 37.6 % — AB (ref 37.7–47.9)
Lymph, poc: 2 (ref 0.6–3.4)
MCH, POC: 23.9 pg — AB (ref 27–31.2)
MCHC: 30.1 g/dL — AB (ref 31.8–35.4)
MCV: 79.6 fL — AB (ref 80–97)
POC LYMPH PERCENT: 31.6 %L (ref 10–50)
RDW, POC: 16.2 %
WBC: 6.3 10*3/uL (ref 4.6–10.2)

## 2011-07-02 LAB — POCT URINALYSIS DIPSTICK
Bilirubin, UA: NEGATIVE
Blood, UA: NEGATIVE
Glucose, UA: 250
Ketones, UA: NEGATIVE
Nitrite, UA: NEGATIVE
Spec Grav, UA: 1.025

## 2011-07-02 LAB — POCT UA - MICROSCOPIC ONLY
Crystals, Ur, HPF, POC: NEGATIVE
RBC, urine, microscopic: NEGATIVE

## 2011-07-02 LAB — POCT GLYCOSYLATED HEMOGLOBIN (HGB A1C): Hemoglobin A1C: 7.1

## 2011-07-02 MED ORDER — METFORMIN HCL 1000 MG PO TABS: 1000.0000 mg | ORAL_TABLET | Freq: Two times a day (BID) | ORAL | Status: DC

## 2011-07-02 MED ORDER — FENOFIBRATE MICRONIZED 134 MG PO CAPS: 134.0000 mg | ORAL_CAPSULE | Freq: Every day | ORAL | Status: DC

## 2011-07-02 MED ORDER — METOPROLOL TARTRATE 25 MG PO TABS: 25.0000 mg | ORAL_TABLET | Freq: Two times a day (BID) | ORAL | Status: DC

## 2011-07-02 MED ORDER — VALSARTAN-HYDROCHLOROTHIAZIDE 320-25 MG PO TABS: 1.0000 | ORAL_TABLET | Freq: Every day | ORAL | Status: DC

## 2011-07-02 NOTE — Progress Notes (Signed)
  Subjective:    Patient ID: [name] [name], female    DOB: [birth year], 77 y.o.   MRN: [id number]  HPI Many problems, see problem list. Ran out of meds and needs RFs Only complaint is R knee arthritis and requests injection. Usually has one per year. Dr. Eulah Pont rec TKR   Review of Systems See list    Objective:   Physical Exam  Constitutional: She is oriented to person, place, and time. She appears well-nourished. No distress.  HENT:  Right Ear: External ear normal.  Left Ear: External ear normal.  Nose: Nose normal.  Eyes: EOM are normal. No scleral icterus.  Neck: Neck supple.  Cardiovascular: Normal rate, regular rhythm, normal heart sounds and intact distal pulses.   Pulmonary/Chest: Effort normal. No respiratory distress.  Musculoskeletal: She exhibits edema and tenderness.  Neurological: She is alert and oriented to person, place, and time. Coordination abnormal.  Skin: Skin is warm and dry.  Psychiatric: She has a normal mood and affect.   Injection knee Sterile prep  Labs Results for orders placed in visit on 07/02/11  POCT CBC      Component Value Range   WBC 6.3  4.6 - 10.2 K/uL   Lymph, poc 2.0  0.6 - 3.4   POC LYMPH PERCENT 31.6  10 - 50 %L   MID (cbc) 0.4  0 - 0.9   POC MID % 6.0  0 - 12 %M   POC Granulocyte 3.9  2 - 6.9   Granulocyte percent 62.4  37 - 80 %G   RBC 4.72  4.04 - 5.48 M/uL   Hemoglobin 11.3 (*) 12.2 - 16.2 g/dL   HCT, POC 78.2 (*) 95.6 - 47.9 %   MCV 79.6 (*) 80 - 97 fL   MCH, POC 23.9 (*) 27 - 31.2 pg   MCHC 30.1 (*) 31.8 - 35.4 g/dL   RDW, POC 21.3     Platelet Count, POC 285  142 - 424 K/uL   MPV 10.9  0 - 99.8 fL  GLUCOSE, POCT (MANUAL RESULT ENTRY)      Component Value Range   POC Glucose 122 (*) 70 - 99 mg/dl  POCT GLYCOSYLATED HEMOGLOBIN (HGB A1C)      Component Value Range   Hemoglobin A1C 7.1    POCT URINALYSIS DIPSTICK      Component Value Range   Color, UA yellow     Clarity, UA clear     Glucose, UA 250     Bilirubin, UA neg     Ketones, UA neg     Spec Grav, UA 1.025     Blood, UA neg     pH, UA 5.0     Protein, UA neg     Urobilinogen, UA 0.2     Nitrite, UA neg     Leukocytes, UA Negative    POCT UA - MICROSCOPIC ONLY      Component Value Range   WBC, Ur, HPF, POC 0-3     RBC, urine, microscopic neg     Bacteria, U Microscopic trace     Mucus, UA neg     Epithelial cells, urine per micros 0-1     Crystals, Ur, HPF, POC neg     Casts, Ur, LPF, POC neg     Yeast, UA neg          Assessment & Plan:  HTN,DM,Lipid, CAD RFs done  To f/up with cardiology soon Establish care at 104

## 2011-07-03 LAB — COMPREHENSIVE METABOLIC PANEL
Albumin: 4 g/dL (ref 3.5–5.2)
Alkaline Phosphatase: 64 U/L (ref 39–117)
BUN: 16 mg/dL (ref 6–23)
Calcium: 9.3 mg/dL (ref 8.4–10.5)
Glucose, Bld: 133 mg/dL — ABNORMAL HIGH (ref 70–99)
Potassium: 4.4 mEq/L (ref 3.5–5.3)

## 2011-07-03 LAB — LIPID PANEL
HDL: 74 mg/dL (ref >39)
LDL Cholesterol: 188 mg/dL — ABNORMAL HIGH (ref 0–99)
Triglycerides: 213 mg/dL — ABNORMAL HIGH (ref <150)

## 2011-07-25 ENCOUNTER — Ambulatory Visit: Payer: PRIVATE HEALTH INSURANCE | Admitting: Cardiology

## 2011-08-01 ENCOUNTER — Other Ambulatory Visit: Payer: Self-pay | Admitting: *Deleted

## 2011-08-01 ENCOUNTER — Other Ambulatory Visit: Payer: Self-pay | Admitting: Internal Medicine

## 2011-08-01 DIAGNOSIS — M25569 Pain in unspecified knee: Secondary | ICD-10-CM

## 2011-08-01 DIAGNOSIS — M170 Bilateral primary osteoarthritis of knee: Secondary | ICD-10-CM

## 2011-08-01 DIAGNOSIS — E119 Type 2 diabetes mellitus without complications: Secondary | ICD-10-CM

## 2011-08-01 DIAGNOSIS — E789 Disorder of lipoprotein metabolism, unspecified: Secondary | ICD-10-CM

## 2011-08-01 DIAGNOSIS — I2510 Atherosclerotic heart disease of native coronary artery without angina pectoris: Secondary | ICD-10-CM

## 2011-08-01 DIAGNOSIS — I10 Essential (primary) hypertension: Secondary | ICD-10-CM

## 2011-08-01 DIAGNOSIS — Z7189 Other specified counseling: Secondary | ICD-10-CM

## 2011-08-01 MED ORDER — METOPROLOL TARTRATE 25 MG PO TABS: 25.0000 mg | ORAL_TABLET | Freq: Two times a day (BID) | ORAL | Status: DC

## 2011-08-01 NOTE — Telephone Encounter (Signed)
Refilled metoprolol 

## 2011-08-15 ENCOUNTER — Encounter: Payer: Self-pay | Admitting: Cardiology

## 2011-08-15 ENCOUNTER — Ambulatory Visit (INDEPENDENT_AMBULATORY_CARE_PROVIDER_SITE_OTHER): Payer: PRIVATE HEALTH INSURANCE | Admitting: Cardiology

## 2011-08-15 VITALS — BP 132/60 | HR 65 | Ht 61.0 in | Wt 199.0 lb

## 2011-08-15 DIAGNOSIS — I2510 Atherosclerotic heart disease of native coronary artery without angina pectoris: Secondary | ICD-10-CM | POA: Diagnosis not present

## 2011-08-15 DIAGNOSIS — E785 Hyperlipidemia, unspecified: Secondary | ICD-10-CM

## 2011-08-15 DIAGNOSIS — Z952 Presence of prosthetic heart valve: Secondary | ICD-10-CM

## 2011-08-15 DIAGNOSIS — I10 Essential (primary) hypertension: Secondary | ICD-10-CM

## 2011-08-15 DIAGNOSIS — Z954 Presence of other heart-valve replacement: Secondary | ICD-10-CM

## 2011-08-15 MED ORDER — EZETIMIBE 10 MG PO TABS: 10.0000 mg | ORAL_TABLET | Freq: Every day | ORAL | Status: DC

## 2011-08-15 NOTE — Assessment & Plan Note (Signed)
Blood pressure is well controlled on combination of metoprolol and Diovan HCT.

## 2011-08-15 NOTE — Progress Notes (Signed)
[name] [name] Date of Birth: January 02, 1935   History of Present Illness: [name] is seen today for a follow up visit. She has a history of coronary disease and is status post CABG. She also had aortic valve replacement for severe aortic stenosis. This was with a pericardial tissue valve. She reports she is doing very well from a cardiac standpoint. She denies any symptoms of chest pain or shortness of breath. She is still quite limited in her activity because of severe arthritis in her knees. She has not proceeded with knee replacement surgery. She reports intolerance to statins because of severe muscle fatigue. This occurred both on Lipitor and Crestor. She eats an extremely high fat diet including fried fat back and use of lard in her cooking. Her recent labs in June showed marked elevation of her lipid levels.  Current Outpatient Prescriptions on File Prior to Visit  Medication Sig Dispense Refill  . acetaminophen (TYLENOL) 500 MG tablet Take 500 mg by mouth every 6 (six) hours as needed.        . clopidogrel (PLAVIX) 75 MG tablet TAKE 1 TABLET BY MOUTH DAILY  30 tablet  5  . fenofibrate micronized (LOFIBRA) 134 MG capsule Take 1 capsule (134 mg total) by mouth daily before breakfast.  90 capsule  3  . metFORMIN (GLUCOPHAGE) 1000 MG tablet Take 1 tablet (1,000 mg total) by mouth 2 (two) times daily with a meal.  180 tablet  3  . metoprolol tartrate (LOPRESSOR) 25 MG tablet Take 1 tablet (25 mg total) by mouth 2 (two) times daily.  60 tablet  5  . valsartan-hydrochlorothiazide (DIOVAN-HCT) 320-25 MG per tablet Take 1 tablet by mouth daily.  30 tablet  6  . ezetimibe (ZETIA) 10 MG tablet Take 1 tablet (10 mg total) by mouth daily.  90 tablet  3    Allergies  Allergen Reactions  . Aspirin   . Crestor (Rosuvastatin Calcium)   . Lipitor (Atorvastatin Calcium)   . Norvasc (Amlodipine Besylate)     Past Medical History  Diagnosis Date  . Diabetes mellitus   . Hypertension   .  Aortic stenosis     s/p AVR August 2012  . Hypercholesterolemia   . OA (osteoarthritis)   . CAD (coronary artery disease)     s/p CABG x 15 August 2010  . Hyperlipidemia   . Degenerative arthritis of knee   . Obesity     Past Surgical History  Procedure Date  . Cardiac catheterization 07/25/2010    Dr [place]  . Coronary artery bypass graft 08/17/10    x1, Dr Laneta Simmers with LIMA to LAD  . Aortic valve replacement 08/17/2010    using a 21-mm Edwards pericardial Magna - Ease valve.     History  Smoking status  . Never Smoker   Smokeless tobacco  . Not on file    History  Alcohol Use No    Family History  Problem Relation Age of Onset  . Tuberculosis Mother   . Heart disease Father     Review of Systems: The review of systems is as above.  She has significant limitation in her walking because of arthritis in her knees. All other systems were reviewed and are negative.  Physical Exam: BP 132/60  Pulse 65  Ht 5\' 1"  (1.549 m)  Wt 90.266 kg (199 lb)  BMI 37.60 kg/m2  SpO2 98% Patient is pleasant and in no acute distress. Skin is warm and dry. Color is normal.  HEENT is unremarkable. Normocephalic/atraumatic. PERRL. Sclera are nonicteric. Neck is supple. No masses. No JVD. Lungs are clear. Cardiac exam shows a regular rate and rhythm. She has a soft outflow murmur. Sternum looks good. Abdomen is obese but soft. Extremities are without edema. Gait and ROM are intact. No gross neurologic deficits noted.   LABORATORY DATA: ECG today demonstrates normal sinus rhythm with left axis deviation nonspecific T-wave abnormality. Lipid panel in June demonstrated cholesterol 305, triglycerides 213, HDL 74, and LDL 188.  Assessment / Plan:

## 2011-08-15 NOTE — Assessment & Plan Note (Signed)
She has marked hypercholesterolemia. She is poorly compliant with her diet. She is statin intolerant. I recommended the addition of Zetia 10 mg daily. She will continue with fenofibrate. I recommended repeating her labs in 3 months. I've instructed her on Mediterranean diet with use of healthy oils. She needs to limit 8 a use of large, fat back, and high saturated fat oils in her diet.

## 2011-08-15 NOTE — Patient Instructions (Addendum)
Continue your current therapy  Start Zetia 10 mg daily for your cholesterol  Have your primary doctor recheck lab work in 3 months.  You need to lose weight. Eat lots of fresh fruits and vegetables ( without fat), eat more beans, fish, and poultry instead of red meat and use olive oil in your cooking instead of lard, fatback, or vegetable oil.  I will see you again in 1 year.

## 2011-08-15 NOTE — Assessment & Plan Note (Signed)
She is asymptomatic and has normal bowel function by exam. Recommend routine SBE prophylaxis.

## 2011-08-15 NOTE — Assessment & Plan Note (Signed)
Status post CABG in August of 2012. She is asymptomatic. Since she is aspirin allergic we will continue Plavix long-term. Continue metoprolol.

## 2011-08-16 ENCOUNTER — Telehealth: Payer: Self-pay | Admitting: Cardiology

## 2011-08-16 NOTE — Telephone Encounter (Signed)
New msg Pt's sister called and said zetia is too expensive. She said her portion is $200 with ins. Please call

## 2011-08-16 NOTE — Telephone Encounter (Signed)
Patient called was told will let Dr.Jordan know zetia too expensive.Will call her back next week.

## 2011-08-20 NOTE — Telephone Encounter (Signed)
Patient called no answer.Left message on personal voice mail spoke to Dr.Jordan and told him zetia was too expensive.He advised to follow a better diet he discussed with you at your last office visit.

## 2011-10-31 DIAGNOSIS — L02619 Cutaneous abscess of unspecified foot: Secondary | ICD-10-CM | POA: Diagnosis not present

## 2011-10-31 DIAGNOSIS — L03039 Cellulitis of unspecified toe: Secondary | ICD-10-CM | POA: Diagnosis not present

## 2012-03-29 ENCOUNTER — Other Ambulatory Visit: Payer: Self-pay | Admitting: Cardiology

## 2012-03-29 IMAGING — CR DG CHEST 2V
2 series · 2 of 2 positions shown · non-contrast
Comparison: Chest x-ray 08/19/2010 peri

CLINICAL DATA: Bypass surgery and aortic valve replacement surgery.

CHEST - 2 VIEW

[w chest pa]
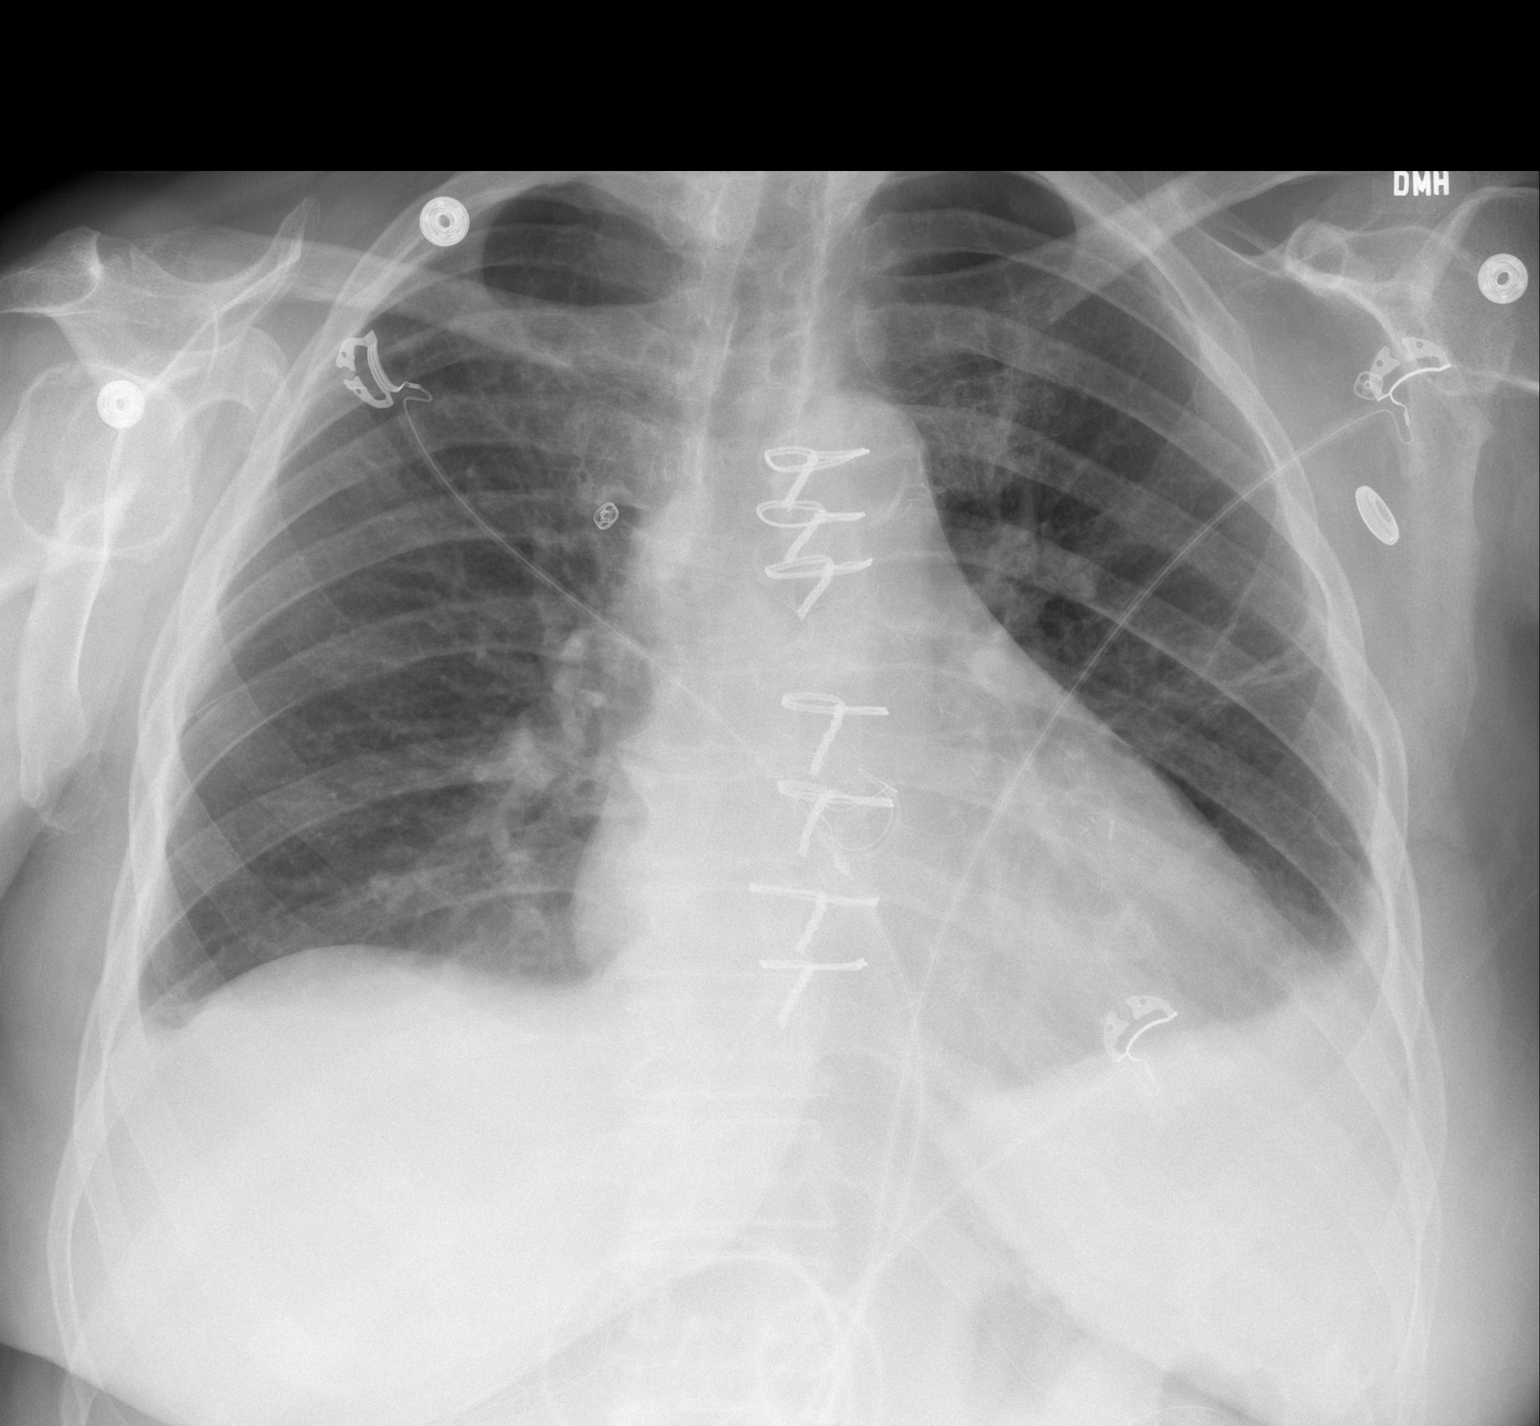

[w chest lat]
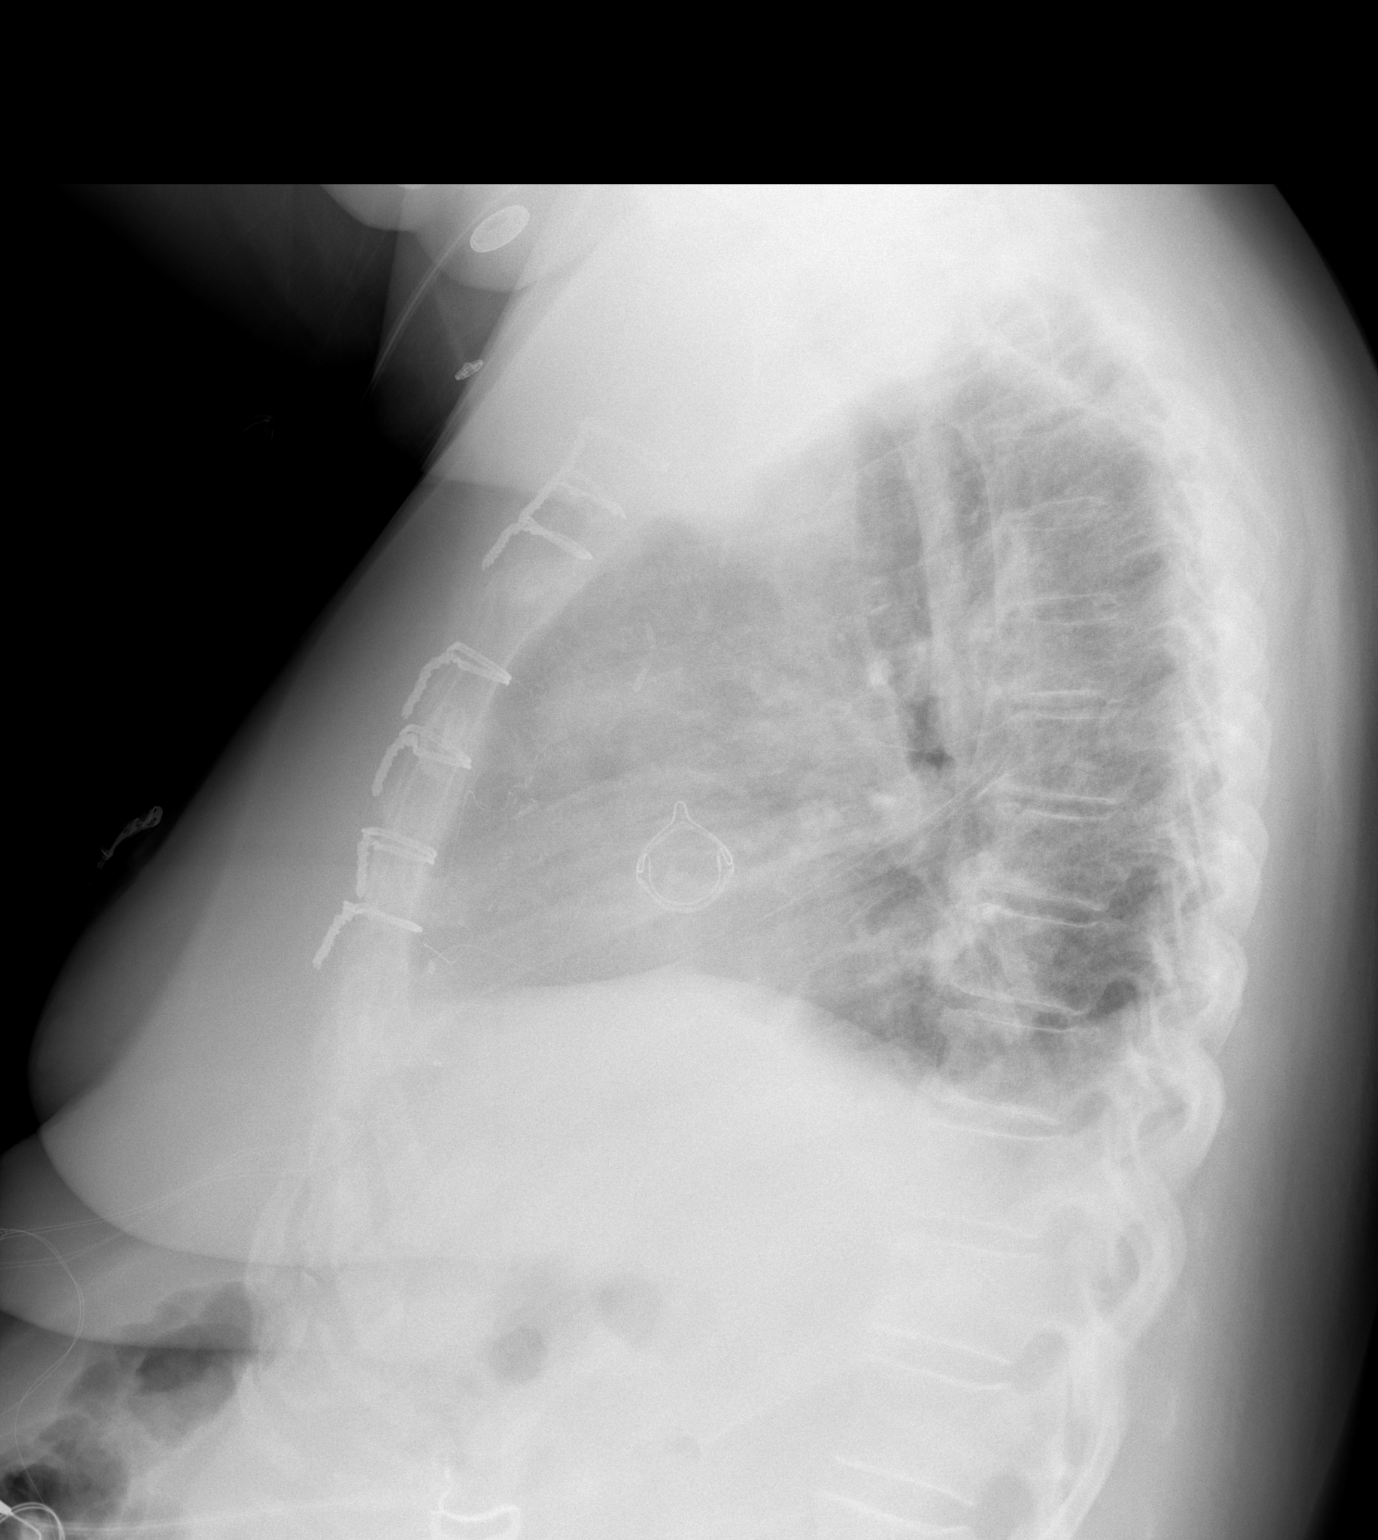

[2 of 2 positions shown; findings below may reference images not displayed]

FINDINGS: The right IJ Cordis has been removed.  The cardiac
silhouette, mediastinal and hilar contours are stable.  There are
persistent small bilateral pleural effusions and minimal overlying
atelectasis.  No pulmonary edema or pneumothorax.
IMPRESSION: 1.  Removal of right IJ Cordis.
2.  Small effusions and minimal areas of atelectasis.

## 2012-04-01 DIAGNOSIS — L03039 Cellulitis of unspecified toe: Secondary | ICD-10-CM | POA: Diagnosis not present

## 2012-04-01 DIAGNOSIS — L600 Ingrowing nail: Secondary | ICD-10-CM | POA: Diagnosis not present

## 2012-04-01 DIAGNOSIS — L918 Other hypertrophic disorders of the skin: Secondary | ICD-10-CM | POA: Diagnosis not present

## 2012-04-20 IMAGING — CR DG CHEST 2V
2 series · 2 of 2 positions shown · non-contrast
Comparison: 08/20/2010

CLINICAL DATA: Postop from aortic valve replacement.  Diabetes and
hypertension.

CHEST - 2 VIEW

[view not recorded (1 of 2)]
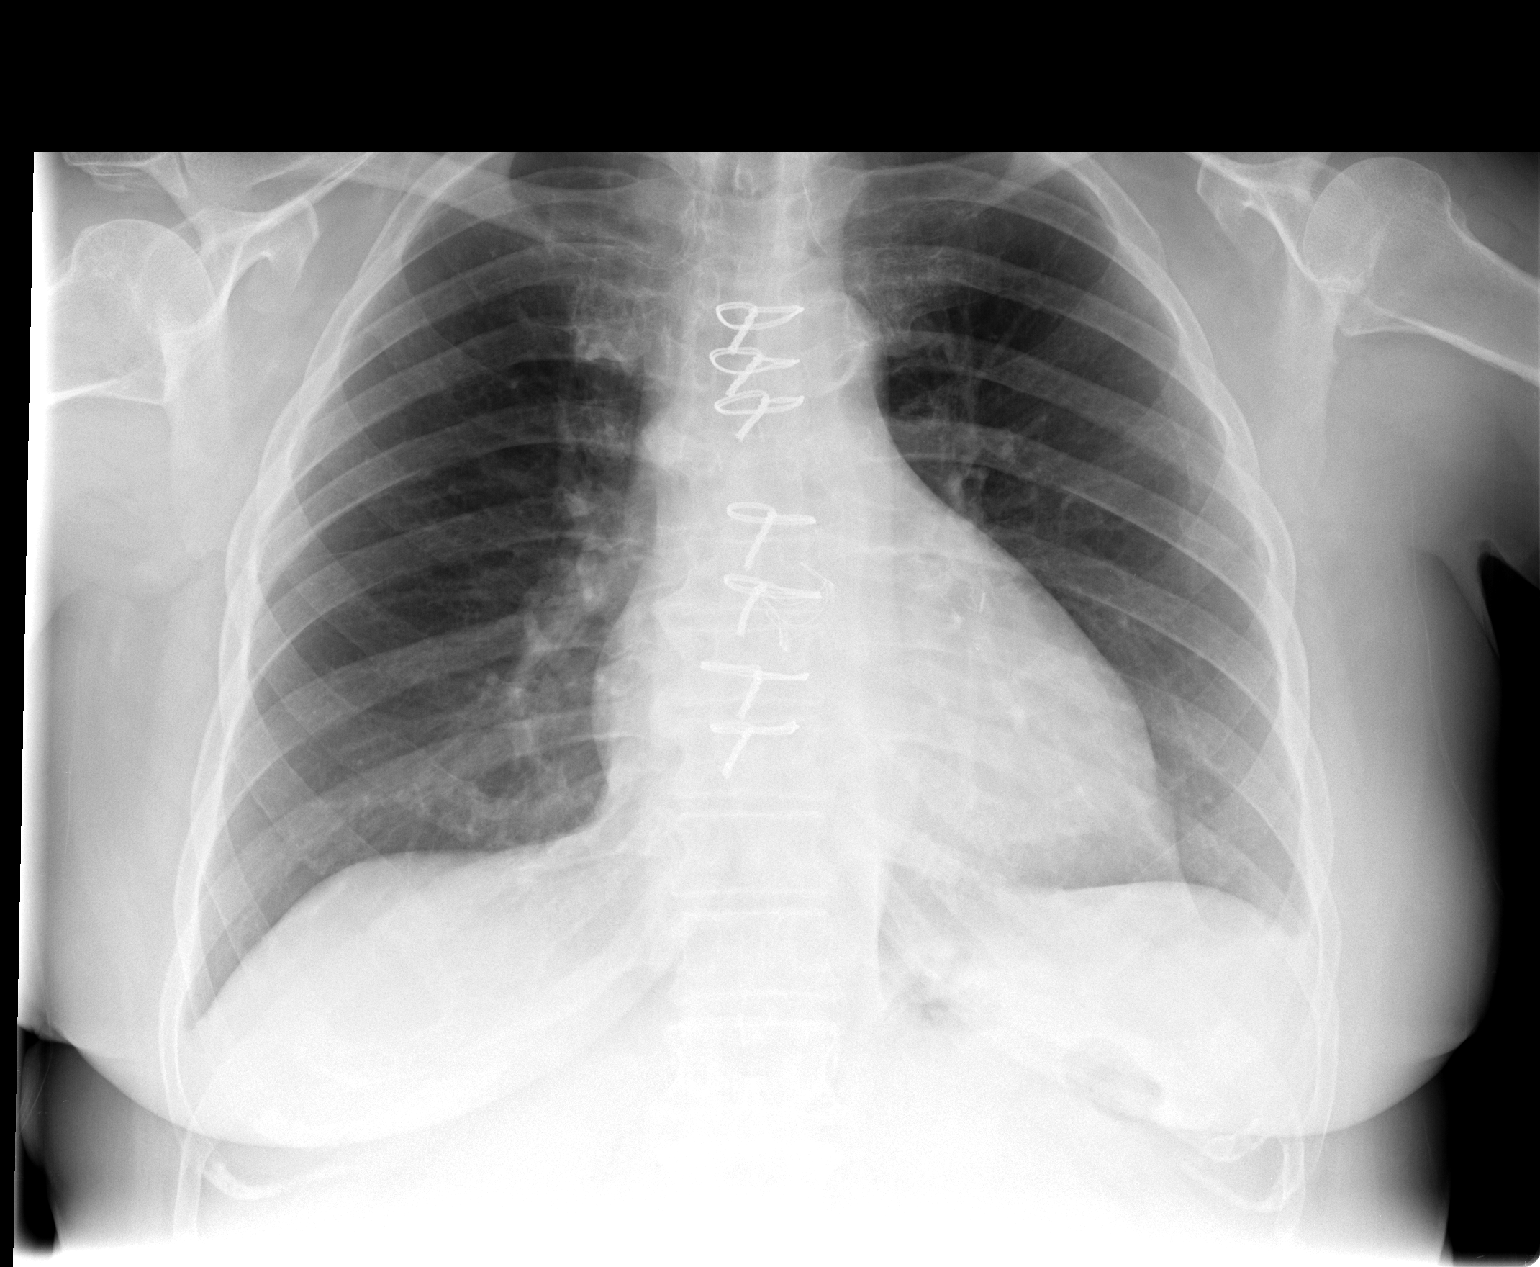

[view not recorded (2 of 2)]
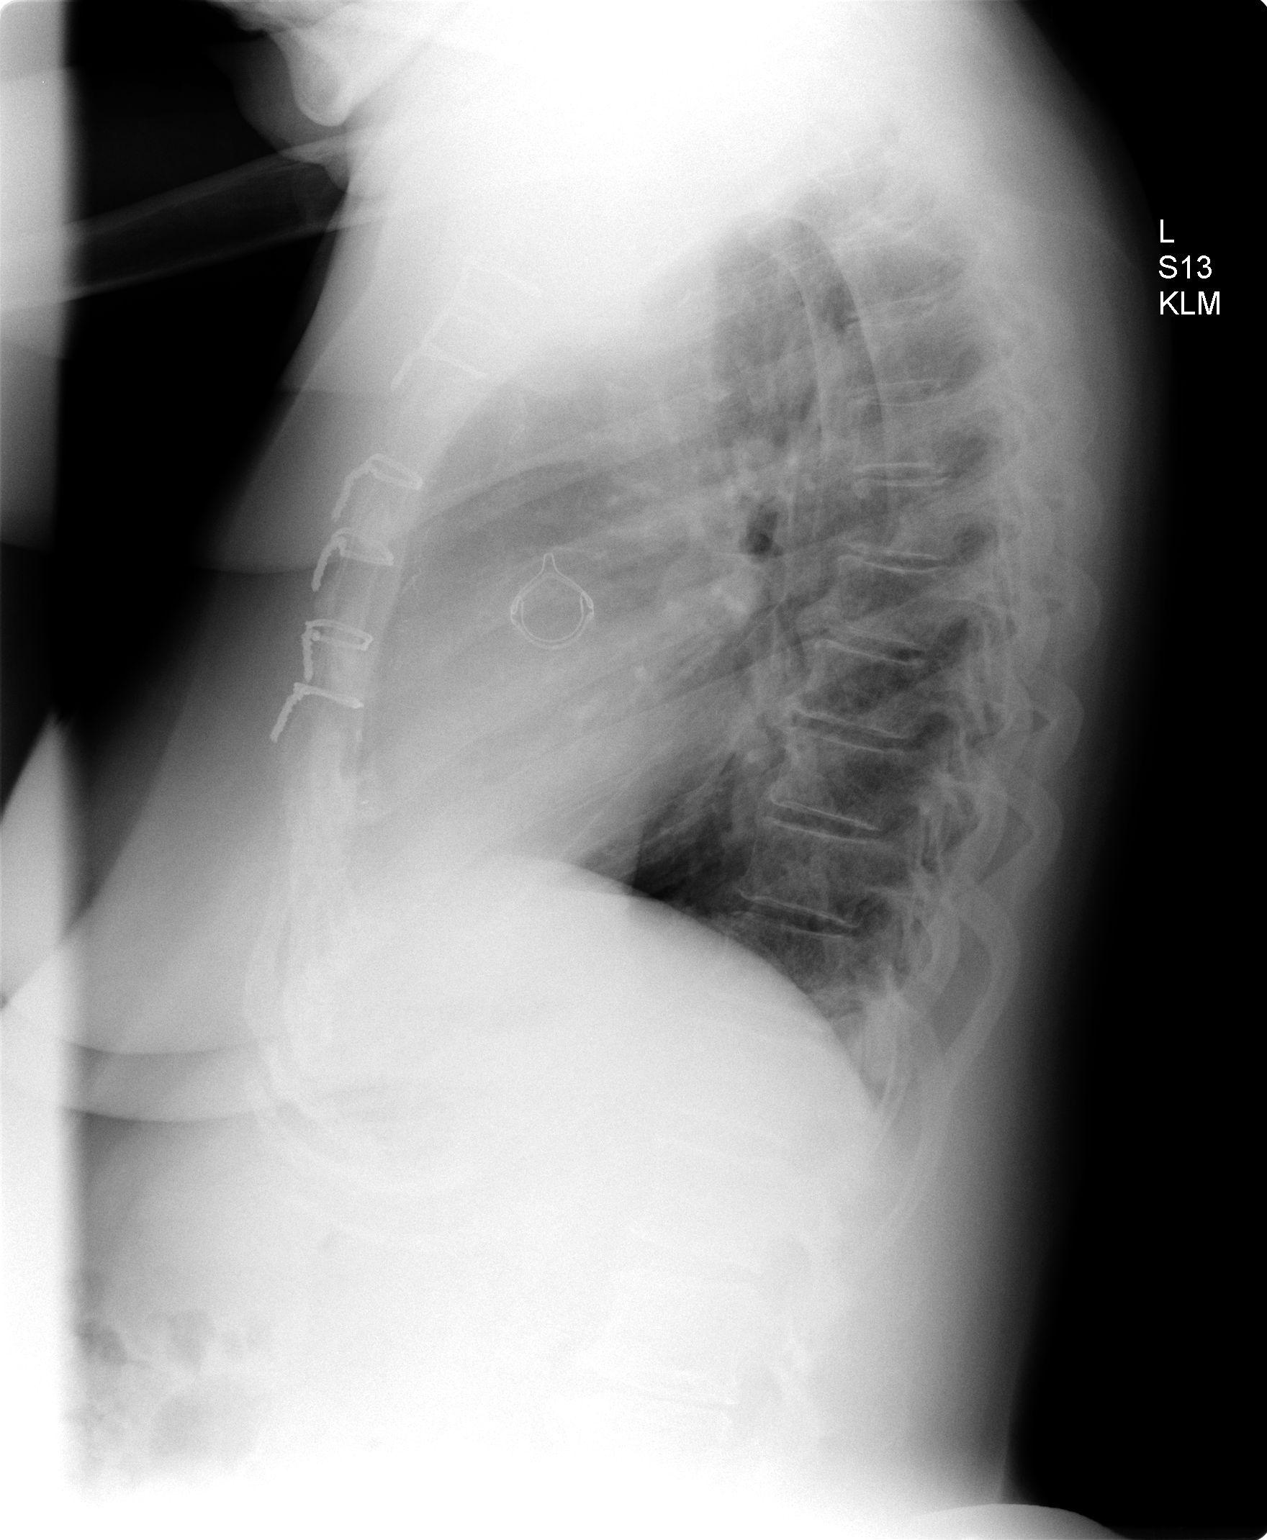

[2 of 2 positions shown; findings below may reference images not displayed]

FINDINGS: Previously seen pleural effusions have resolved.  Both
lungs are clear.  No evidence of congestive heart failure.  Heart
size is within normal limits.  Previous mediasternotomy and aortic
valve replacement are seen.  No mass or lymphadenopathy identified.
IMPRESSION: Resolution of pleural effusion since prior study.  No active
disease.

## 2012-04-27 ENCOUNTER — Other Ambulatory Visit: Payer: Self-pay | Admitting: Internal Medicine

## 2012-05-19 ENCOUNTER — Telehealth: Payer: Self-pay

## 2012-05-19 NOTE — Telephone Encounter (Signed)
Patient called no answer.Left message on personal voice mail to call office need to schedule appointment with Dr.Jordan for surgical clearance.

## 2012-05-22 ENCOUNTER — Ambulatory Visit: Payer: Medicare (Managed Care) | Admitting: Nurse Practitioner

## 2012-06-10 ENCOUNTER — Encounter: Payer: Self-pay | Admitting: Physician Assistant

## 2012-06-10 ENCOUNTER — Ambulatory Visit (INDEPENDENT_AMBULATORY_CARE_PROVIDER_SITE_OTHER): Payer: Medicare Other | Admitting: Physician Assistant

## 2012-06-10 VITALS — BP 156/70 | HR 77 | Ht 61.0 in | Wt 197.0 lb

## 2012-06-10 DIAGNOSIS — Z01818 Encounter for other preprocedural examination: Secondary | ICD-10-CM | POA: Diagnosis not present

## 2012-06-10 DIAGNOSIS — Z954 Presence of other heart-valve replacement: Secondary | ICD-10-CM

## 2012-06-10 DIAGNOSIS — Z952 Presence of prosthetic heart valve: Secondary | ICD-10-CM

## 2012-06-10 DIAGNOSIS — E785 Hyperlipidemia, unspecified: Secondary | ICD-10-CM

## 2012-06-10 DIAGNOSIS — E7800 Pure hypercholesterolemia, unspecified: Secondary | ICD-10-CM

## 2012-06-10 DIAGNOSIS — I2510 Atherosclerotic heart disease of native coronary artery without angina pectoris: Secondary | ICD-10-CM

## 2012-06-10 DIAGNOSIS — I10 Essential (primary) hypertension: Secondary | ICD-10-CM

## 2012-06-10 LAB — LIPID PANEL
HDL: 70.8 mg/dL (ref >39.00)
VLDL: 21.8 mg/dL (ref 0.0–40.0)

## 2012-06-10 LAB — HEPATIC FUNCTION PANEL
Bilirubin, Direct: 0 mg/dL (ref 0.0–0.3)
Total Bilirubin: 0.4 mg/dL (ref 0.3–1.2)

## 2012-06-10 NOTE — Progress Notes (Signed)
HPI:   This is a 78 year old female patient of Dr. [place] who has history of CABG x1 and AVR with a pericardial tissue valve  In 2012. Ejection fraction was 55% at that time with grade 1 diastolic dysfunction. She last saw Dr. [place] on 08/15/11 at which time she was on an extremely high fat diet and he advised her to follow Mediterranean diet.  The patient is here today for preop surgical clearance before undergoing right knee replacement by Dr. Eulah Pont. She denies any chest pain, palpitations, dyspnea, dyspnea on exertion, dizziness, or presyncope. The patient often forgets to take her medications as she leaves the house in the morning. She continues to cook very high fat diet and does not exercise.  Allergies:  -- Aspirin   -- Crestor (Rosuvastatin Calcium)   -- Lipitor (Atorvastatin Calcium)   -- Norvasc (Amlodipine Besylate)   Current Outpatient Prescriptions on File Prior to Visit: acetaminophen (TYLENOL) 500 MG tablet, Take 500 mg by mouth every 6 (six) hours as needed.  , Disp: , Rfl:  clopidogrel (PLAVIX) 75 MG tablet, TAKE 1 TABLET BY MOUTH DAILY, Disp: 30 tablet, Rfl: 5 fenofibrate micronized (LOFIBRA) 134 MG capsule, Take 1 capsule (134 mg total) by mouth daily before breakfast., Disp: 90 capsule, Rfl: 3 metFORMIN (GLUCOPHAGE) 1000 MG tablet, Take 1 tablet (1,000 mg total) by mouth 2 (two) times daily with a meal., Disp: 180 tablet, Rfl: 3 metoprolol tartrate (LOPRESSOR) 25 MG tablet, Take 1 tablet (25 mg total) by mouth 2 (two) times daily., Disp: 60 tablet, Rfl: 5 valsartan-hydrochlorothiazide (DIOVAN-HCT) 320-25 MG per tablet, TAKE 1 TABLET BY MOUTH EVERY DAY, Disp: 30 tablet, Rfl: 0  No current facility-administered medications on file prior to visit.   Past Medical History:   Diabetes mellitus                                            Hypertension                                                 Aortic stenosis                                                Comment:s/p AVR  August 2012   Hypercholesterolemia                                         OA (osteoarthritis)                                          CAD (coronary artery disease)                                  Comment:s/p CABG x 15 August 2010   Hyperlipidemia  Degenerative arthritis of knee                               Obesity                                                     Past Surgical History:   CARDIAC CATHETERIZATION                          07/25/2010      Comment:Dr [place]   CORONARY ARTERY BYPASS GRAFT                     08/17/10       Comment:x1, Dr Laneta Simmers with LIMA to LAD   AORTIC VALVE REPLACEMENT                         08/17/2010     Comment:using a 21-mm Edwards pericardial Magna - Ease               valve.   Review of patient's family history indicates:   Tuberculosis                   Mother                   Heart disease                  Father                   Social History   Marital Status: Single              Spouse Name:                      Years of Education:                 Number of children: 3           Occupational History   None on file  Social History Main Topics   Smoking Status: Never Smoker                     Smokeless Status: Not on file                      Alcohol Use: No             Drug Use: No             Sexual Activity: No                 Other Topics            Concern   None on file  Social History Narrative   None on file    ROS:see history of present illness otherwise negative   PHYSICAL EXAM: Obese, in no acute distress. Neck: No JVD, HJR, Bruit, or thyroid enlargement  Lungs: No tachypnea, clear without wheezing, rales, or rhonchi  Cardiovascular: RRR, PMI not displaced, 2/6 systolic murmur at the left sternal border, no gallops, bruit, thrill, or heave.  Abdomen: BS normal. Soft without organomegaly, masses, lesions or tenderness.  Extremities: without cyanosis, clubbing  or edema. Good distal pulses bilateral  SKin: Warm, no lesions or rashes   Musculoskeletal: No deformities  Neuro: no focal signs  BP 156/70  Pulse 77  Ht 5\' 1"  (1.549 m)  Wt 197 lb (89.359 kg)  BMI 37.24 kg/m2   ZOX:WRUEAV sinus rhythm with nonspecific ST-T wave abnormality, no acute change   2Decho 10/22/10: Study Conclusions  - Left ventricle: Distal septal hypokinesis The cavity size   was normal. Systolic function was normal. The estimated   ejection fraction was 55%. Wall motion was normal; there   were no regional wall motion abnormalities. Doppler   parameters are consistent with abnormal left ventricular   relaxation (grade 1 diastolic dysfunction). - Aortic valve: Prosthetic AVR not well seen but normal   systolic gradient and no perivalvular AR A pericardial   bioprosthesis was present. - Left atrium: The atrium was mildly dilated. - Atrial septum: No defect or patent foramen ovale was   identified.

## 2012-06-10 NOTE — Assessment & Plan Note (Signed)
Stable without symptoms.

## 2012-06-10 NOTE — Assessment & Plan Note (Signed)
Patient eats an extremely high fat diet including fatback. A Mediterranean diet has been given to her and she's been instructed on a low-fat diet. Fasting lipid panel to be checked today.

## 2012-06-10 NOTE — Patient Instructions (Addendum)
YOU HAVE BEEN INSTRUCTED TO TRY AND EXERCISE MORE  YOU HAVE BEEN ASKED BY DR. Patty Sermons TO FOLLOW A MEDITERRANEAN DIET ( LOW FAT)  LABS TODAY; FASTING LIPID AND LIVER PANEL

## 2012-06-10 NOTE — Assessment & Plan Note (Signed)
Patient's blood pressure is low high today but she has not taken her medications yet

## 2012-06-10 NOTE — Assessment & Plan Note (Signed)
Patient is here today for preoperative surgical clearance before undergoing right knee replacement. Patient is 2 years out status post CABG in aVR for severe aortic stenosis. This was a pericardial tissue valve. She takes Plavix because she is allergic to aspirin. She has done well without cardiac symptoms. I discussed this patient in detail with Dr. Berton Mount who agrees that the patient is at reasonable risk for surgery without further cardiac workup.

## 2012-06-18 ENCOUNTER — Telehealth: Payer: Self-pay | Admitting: *Deleted

## 2012-06-18 NOTE — Telephone Encounter (Signed)
Message copied by Tarri Fuller on Thu Jun 18, 2012  1:35 PM ------      Message from: Prescott Gum      Created: Thu Jun 11, 2012  7:19 AM       Cholesterol very high. Needs to follow low fat diet given yesterday. Follow up lipids 6 months ------

## 2012-06-18 NOTE — Telephone Encounter (Signed)
lmptcb to go over lab results and the need to make sure to follow a low fat as advised on last OV with Michelle Lenze. need to have FLP/LFT in 6 months 

## 2012-06-18 NOTE — Telephone Encounter (Signed)
lmptcb to go over lab results and the need to make sure to follow a low fat as advised on last OV with [name] [name]. need to have FLP/LFT in 6 months

## 2012-06-19 DIAGNOSIS — M1710 Unilateral primary osteoarthritis, unspecified knee: Secondary | ICD-10-CM | POA: Diagnosis not present

## 2012-06-23 ENCOUNTER — Encounter: Payer: Self-pay | Admitting: *Deleted

## 2012-06-23 NOTE — Telephone Encounter (Signed)
Letter sent out today about lab results from when pt saw Herma Carson, PA.

## 2012-07-03 ENCOUNTER — Telehealth: Payer: Self-pay | Admitting: Cardiology

## 2012-07-03 NOTE — Telephone Encounter (Signed)
Spoke to patient's sister Rito Ehrlich.She stated patient has been having chest pain off and on for 2 weeks.Stated she is not having any chest pain at present.Stated she would like appointment next week.Advised to go to ER if needed, patient requested to see Dr.Jordan. I will call back with appointment.

## 2012-07-03 NOTE — Telephone Encounter (Signed)
New Prob     Pt is currently having chest pains and has lasted for about 2 weeks. Would like to speak to nurse regarding getting in. Please call.

## 2012-07-06 ENCOUNTER — Other Ambulatory Visit: Payer: Self-pay | Admitting: Internal Medicine

## 2012-07-06 ENCOUNTER — Other Ambulatory Visit: Payer: Self-pay | Admitting: Cardiology

## 2012-07-06 NOTE — Telephone Encounter (Signed)
Dyann Kief, PA-C at 06/10/2012 2:35 PM metoprolol tartrate (LOPRESSOR) 25 MG tablet, Take 1 tablet (25 mg total) by mouth 2 (two) times daily., Disp: 60 tablet, Rfl: 5

## 2012-07-07 ENCOUNTER — Other Ambulatory Visit: Payer: Self-pay | Admitting: Internal Medicine

## 2012-07-10 ENCOUNTER — Encounter: Payer: Self-pay | Admitting: Cardiology

## 2012-07-10 ENCOUNTER — Ambulatory Visit (INDEPENDENT_AMBULATORY_CARE_PROVIDER_SITE_OTHER): Payer: Medicare Other | Admitting: Cardiology

## 2012-07-10 VITALS — BP 150/72 | HR 58 | Ht 61.0 in | Wt 196.1 lb

## 2012-07-10 DIAGNOSIS — E7800 Pure hypercholesterolemia, unspecified: Secondary | ICD-10-CM

## 2012-07-10 DIAGNOSIS — E785 Hyperlipidemia, unspecified: Secondary | ICD-10-CM | POA: Diagnosis not present

## 2012-07-10 DIAGNOSIS — Z954 Presence of other heart-valve replacement: Secondary | ICD-10-CM | POA: Diagnosis not present

## 2012-07-10 DIAGNOSIS — Z952 Presence of prosthetic heart valve: Secondary | ICD-10-CM

## 2012-07-10 DIAGNOSIS — I359 Nonrheumatic aortic valve disorder, unspecified: Secondary | ICD-10-CM

## 2012-07-10 DIAGNOSIS — I10 Essential (primary) hypertension: Secondary | ICD-10-CM | POA: Diagnosis not present

## 2012-07-10 DIAGNOSIS — I350 Nonrheumatic aortic (valve) stenosis: Secondary | ICD-10-CM

## 2012-07-10 DIAGNOSIS — I2510 Atherosclerotic heart disease of native coronary artery without angina pectoris: Secondary | ICD-10-CM

## 2012-07-10 MED ORDER — EZETIMIBE 10 MG PO TABS: 10.0000 mg | ORAL_TABLET | Freq: Every day | ORAL | Status: DC

## 2012-07-10 NOTE — Patient Instructions (Signed)
Continue your current medication.  I recommend you stay on Zetia.  I will see you in one year.

## 2012-07-10 NOTE — Progress Notes (Signed)
[name] [name] Date of Birth: [birth year]   History of Present Illness: [name] is seen today for a yearly follow up visit. She has a history of coronary disease and is status post CABG. She also had aortic valve replacement for severe aortic stenosis. This was with a pericardial tissue valve. She has a history of severe hypercholesterolemia. She is intolerant of statin therapy. She eats a very high fat diet. On followup today she is doing well. She has had no significant chest pain or shortness of breath. She is sedentary. She has lost 3 pounds from last year.  Current Outpatient Prescriptions on File Prior to Visit  Medication Sig Dispense Refill  . acetaminophen (TYLENOL) 500 MG tablet Take 500 mg by mouth every 6 (six) hours as needed.        . clopidogrel (PLAVIX) 75 MG tablet TAKE 1 TABLET BY MOUTH DAILY  30 tablet  5  . fenofibrate micronized (LOFIBRA) 134 MG capsule Take 1 capsule (134 mg total) by mouth daily before breakfast.  90 capsule  3  . metFORMIN (GLUCOPHAGE) 1000 MG tablet Take 1 tablet (1,000 mg total) by mouth 2 (two) times daily with a meal.  180 tablet  3  . metoprolol tartrate (LOPRESSOR) 25 MG tablet TAKE 1 TABLET BY MOUTH TWICE A DAY  60 tablet  1  . valsartan-hydrochlorothiazide (DIOVAN-HCT) 320-25 MG per tablet Take 1 tablet by mouth daily. PATIENT NEEDS OFFICE VISIT FOR ADDITIONAL REFILLS - 2nd NOTICE!  15 tablet  0   No current facility-administered medications on file prior to visit.    Allergies  Allergen Reactions  . Aspirin   . Crestor (Rosuvastatin Calcium)   . Lipitor (Atorvastatin Calcium)   . Norvasc (Amlodipine Besylate)     Past Medical History  Diagnosis Date  . Diabetes mellitus   . Hypertension   . Aortic stenosis     s/p AVR August 2012  . Hypercholesterolemia   . OA (osteoarthritis)   . CAD (coronary artery disease)     s/p CABG x 15 August 2010  . Hyperlipidemia   . Degenerative arthritis of knee   . Obesity     Past  Surgical History  Procedure Laterality Date  . Cardiac catheterization  07/25/2010    Dr Swaziland  . Coronary artery bypass graft  08/17/10    x1, Dr Laneta Simmers with LIMA to LAD  . Aortic valve replacement  08/17/2010    using a 21-mm Edwards pericardial Magna - Ease valve.     History  Smoking status  . Never Smoker   Smokeless tobacco  . Not on file    History  Alcohol Use No    Family History  Problem Relation Age of Onset  . Tuberculosis Mother   . Heart disease Father     Review of Systems: The review of systems is as above.  She has significant limitation in her walking because of arthritis in her knees. All other systems were reviewed and are negative.  Physical Exam: BP 150/72  Pulse 58  Ht 5\' 1"  (1.549 m)  Wt 196 lb 1.9 oz (88.959 kg)  BMI 37.08 kg/m2  SpO2 96% Patient is pleasant and in no acute distress. Skin is warm and dry. Color is normal.  HEENT is unremarkable. Normocephalic/atraumatic. PERRL. Sclera are nonicteric. Neck is supple. No masses. No JVD. Lungs are clear. Cardiac exam shows a regular rate and rhythm. She has a soft outflow murmur. Sternum looks good. Abdomen is obese  but soft. Extremities are without edema. Gait and ROM are intact. No gross neurologic deficits noted.   LABORATORY DATA: Lab Results  Component Value Date   WBC 6.3 07/02/2011   HGB 11.3* 07/02/2011   HCT 37.6* 07/02/2011   PLT 220.0 09/19/2010   GLUCOSE 133* 07/02/2011   CHOL 307* 06/10/2012   TRIG 109.0 06/10/2012   HDL 70.80 06/10/2012   LDLDIRECT 212.9 06/10/2012   LDLCALC 188* 07/02/2011   ALT 17 06/10/2012   AST 22 06/10/2012   NA 142 07/02/2011   K 4.4 07/02/2011   CL 106 07/02/2011   CREATININE 0.88 07/02/2011   BUN 16 07/02/2011   CO2 31 07/02/2011   TSH 3.706 07/02/2011   INR 1.49 08/17/2010   HGBA1C 7.1 07/02/2011     Assessment / Plan: 1. Coronary disease status post single vessel CABG with LIMA graft to the LAD in August 2012. She is asymptomatic. Continue metoprolol and  Plavix therapy. I'll followup again in one year.  2. Aortic stenosis status post tissue aortic valve replacement August 2012. Stable by exam.  3. Marked hypercholesterolemia. Intolerant to statin therapy. I recommended she take Zetia 10 mg daily. Instructed on a heart healthy diet.  4. Hypertension-controlled.

## 2012-07-14 ENCOUNTER — Other Ambulatory Visit: Payer: Self-pay | Admitting: Internal Medicine

## 2012-07-23 ENCOUNTER — Ambulatory Visit: Payer: Medicare Other | Admitting: Physician Assistant

## 2012-07-31 ENCOUNTER — Other Ambulatory Visit: Payer: Self-pay | Admitting: Physician Assistant

## 2012-07-31 ENCOUNTER — Other Ambulatory Visit: Payer: Self-pay | Admitting: Internal Medicine

## 2012-08-16 ENCOUNTER — Other Ambulatory Visit: Payer: Self-pay | Admitting: Physician Assistant

## 2012-08-19 ENCOUNTER — Other Ambulatory Visit: Payer: Self-pay | Admitting: Orthopedic Surgery

## 2012-08-19 ENCOUNTER — Encounter (HOSPITAL_COMMUNITY): Payer: Self-pay | Admitting: Pharmacy Technician

## 2012-08-20 NOTE — Pre-Procedure Instructions (Signed)
[name] [name]  08/20/2012   Your procedure is scheduled on:  August 26, 2012 at 8:30  Report to [facility] at 6:30 AM.  Call this number if you have problems the morning of surgery: [phone]   Remember:   Do not eat food or drink liquids after midnight.   Take these medicines the morning of surgery with A SIP OF WATER: metoprolol tartrate,  (LOPRESSOR), acetaminophen (TYLENOL)   Discontinue Aspirin, Coumadin, Plavix, Effient and herbal medication 7 days prior to surgery.     Do not wear jewelry, make-up or nail polish.  Do not wear lotions, powders, or perfumes. You may wear deodorant.  Do not shave 48 hours prior to surgery. Men may shave face and neck.  Do not bring valuables to the hospital.  [facility] is not responsible                   for any belongings or valuables.  Contacts, dentures or bridgework may not be worn into surgery.  Leave suitcase in the car. After surgery it may be brought to your room.  For patients admitted to the hospital, checkout time is 11:00 AM the day of  discharge.    Special Instructions: Shower using CHG 2 nights before surgery and the night before surgery.  If you shower the day of surgery use CHG.  Use special wash - you have one bottle of CHG for all showers.  You should use approximately 1/3 of the bottle for each shower.   Please read over the following fact sheets that you were given: Pain Booklet, Coughing and Deep Breathing, Blood Transfusion Information, Total Joint Packet, MRSA Information and Surgical Site Infection Prevention

## 2012-08-21 ENCOUNTER — Encounter (HOSPITAL_COMMUNITY)
Admission: RE | Admit: 2012-08-21 | Discharge: 2012-08-21 | Disposition: A | Discharge status: Home / Self Care | Payer: Medicare Other | Source: Ambulatory Visit | Attending: Orthopedic Surgery | Admitting: Orthopedic Surgery

## 2012-08-21 ENCOUNTER — Encounter (HOSPITAL_COMMUNITY): Payer: Self-pay

## 2012-08-21 DIAGNOSIS — Z01812 Encounter for preprocedural laboratory examination: Secondary | ICD-10-CM | POA: Insufficient documentation

## 2012-08-21 DIAGNOSIS — Z01818 Encounter for other preprocedural examination: Secondary | ICD-10-CM | POA: Diagnosis not present

## 2012-08-21 LAB — COMPREHENSIVE METABOLIC PANEL
AST: 21 U/L (ref 0–37)
Albumin: 3.3 g/dL — ABNORMAL LOW (ref 3.5–5.2)
Alkaline Phosphatase: 79 U/L (ref 39–117)
BUN: 14 mg/dL (ref 6–23)
CO2: 25 mEq/L (ref 19–32)
Chloride: 107 mEq/L (ref 96–112)
Creatinine, Ser: 0.79 mg/dL (ref 0.50–1.10)
GFR calc non Af Amer: 79 mL/min — ABNORMAL LOW (ref >90)
Potassium: 4.1 mEq/L (ref 3.5–5.1)
Total Bilirubin: 0.2 mg/dL — ABNORMAL LOW (ref 0.3–1.2)

## 2012-08-21 LAB — CBC
HCT: 35.2 % — ABNORMAL LOW (ref 36.0–46.0)
MCV: 78.6 fL (ref 78.0–100.0)
RBC: 4.48 MIL/uL (ref 3.87–5.11)
RDW: 15.9 % — ABNORMAL HIGH (ref 11.5–15.5)
WBC: 5.6 10*3/uL (ref 4.0–10.5)

## 2012-08-21 LAB — URINALYSIS, ROUTINE W REFLEX MICROSCOPIC
Bilirubin Urine: NEGATIVE
Hgb urine dipstick: NEGATIVE
Ketones, ur: NEGATIVE mg/dL
Nitrite: NEGATIVE
Specific Gravity, Urine: 1.024 (ref 1.005–1.030)
pH: 5.5 (ref 5.0–8.0)

## 2012-08-21 LAB — APTT: aPTT: 30 seconds (ref 24–37)

## 2012-08-21 LAB — PROTIME-INR: INR: 0.97 (ref 0.00–1.49)

## 2012-08-24 ENCOUNTER — Telehealth: Payer: Self-pay

## 2012-08-24 NOTE — Progress Notes (Signed)
Requested ekg/cxr from urgent care [phone].

## 2012-08-24 NOTE — Telephone Encounter (Signed)
Patient last EKG from our office is from 2012. No CXR in paper chart. Recent 2014 EKG was done at [facility] and is already scanned into Epic. [place] notified.

## 2012-08-24 NOTE — Telephone Encounter (Signed)
KATHY FROM [place] STATES THEY NEED THE EKG AND CHEST XRAY FAXED TO THEM ON PT PLEASE FAX TO [phone] AND THE PHONE NUMBER IS [phone]

## 2012-08-25 ENCOUNTER — Other Ambulatory Visit: Payer: Self-pay | Admitting: Orthopedic Surgery

## 2012-08-25 MED ORDER — CEFAZOLIN SODIUM-DEXTROSE 2-3 GM-% IV SOLR
2.0000 g | INTRAVENOUS | Status: AC
  Administered 2012-08-26: 2 g via INTRAVENOUS
  Filled 2012-08-25: qty 50

## 2012-08-25 NOTE — H&P (Signed)
[facility] [address]   [address] [place], [place] [address] [phone] A Division of [facility]  [name] [name], M.D.   [name] [name], M.D.   [name] [name], M.D.   [name] [name], M.D.   [name] [name], M.D [name] [name], M.D.  [name] [name], M.D.   [name] [name], M.D.   [name] [name], M.D. Genene Churn. Barry Dienes, PA-C            Kirstin A. Shepperson, PA-C Josh Tallula, PA-C [place], [place]   RE: [name], [name]                                [id number]      DOB: 02/19/1934 PROGRESS NOTE: 08-11-12 47 five year-old woman who is here with right knee end stage DJD.  The symptoms of her degenerative joint disease are unchanged and she wishes to proceed with a total knee replacement.  Pain has now proceeded to the point that it interferes with activities of daily living, interrupts sleep on a weekly basis and has marked functional impact.  She has tried all conservative therapy, including injection and lifestyle modification, none of which have resulted in any decreased pain.  Currently having to take occasional pain medication to tolerate.  The patient has a significant medical history, including aortic valve replacement for which she takes Plavix.  Her cardiologist is Dr. Swaziland and her primary care physician is Dr. Alwyn Ren.  Past medical history: Positive for hypertension and diabetes.   Current medications: Metformin, Fenofibrate, Valsartan, Metoprolol and Clopidogrel.    Allergies: Norvasc (swelling), as well as Aspirin (stomach irritation).  Past surgical history: Other surgeries include surgery on her lumbar spine.  She has had no adverse effects to any type of anesthesia to her recollection.   Review of systems: Positive for upper dentures, ankle swelling, frequent night urination and occasional headache.  Family history: Positive for heart disease, hypertension, diabetes and cancer. Social history:  Does not smoke or drink.  She lives alone in a single story home that does have some activity in the basement, I think the laundry room is there.    EXAMINATION: Height: 5?4.  Weight: 198 pounds.  BMI: 31.  Blood pressure: 185/71.  Pulse: 61.  Temperature: 97.5.  Head is normocephalic, a traumatic.  PERRLA, EOMI.  Lungs: CTA bilaterally.  Heart: Positive murmur consistent with aortic valve replacement, otherwise regular rhythm.  Abdomen: Soft and non-tender.  Right knee: Positive marked antalgic gait.  Negative straight leg raise or log roll.  Right knee 5 degrees of varus.  Range of motion 5-90 degrees with marked tibiofemoral and patellofemoral crepitus.  Calf non-tender.  Distal pulses intact.  She has peripheral neuropathy bilaterally with particularly notably decreased sensation in the fourth and fifth toes of both feet.     X-RAYS: Plain radiographs with sizing markers show degenerative joint changes, bone on bone on the right, with varus and some tibiofemoral translation.    IMPRESSION: Right knee severe end stage degenerative joint disease.   PLAN: The patient wishes to proceed with a right total knee arthroplasty as scheduled.  The risks and benefits were discussed at some length, as well as discussion of possible complications.  She does wish to proceed with the surgical intervention.  She will return to see Dr. Eulah Pont postoperatively.  The patient wishes to go to [facility] postoperatively for further  rehab.  She will stop her Plavix five days prior to her surgery, as per Dr. Swaziland, her cardiologist.    [name] [name], M.D. Electronically verified by [name] [name], M.D. DFM(BR):jjh D 08-12-12 T 08-13-12

## 2012-08-26 ENCOUNTER — Inpatient Hospital Stay (HOSPITAL_COMMUNITY)
Admission: RE | Admit: 2012-08-26 | Discharge: 2012-08-29 | DRG: 470 | Disposition: A | Discharge status: Skilled Nursing Facility | Payer: Medicare Other | Source: Ambulatory Visit | Attending: Orthopedic Surgery | Admitting: Orthopedic Surgery

## 2012-08-26 ENCOUNTER — Inpatient Hospital Stay (HOSPITAL_COMMUNITY): Payer: Medicare Other

## 2012-08-26 ENCOUNTER — Inpatient Hospital Stay (HOSPITAL_COMMUNITY): Payer: Medicare Other | Admitting: Certified Registered Nurse Anesthetist

## 2012-08-26 ENCOUNTER — Encounter (HOSPITAL_COMMUNITY): Payer: Self-pay | Admitting: *Deleted

## 2012-08-26 ENCOUNTER — Encounter (HOSPITAL_COMMUNITY)
Admission: RE | Disposition: A | Discharge status: Skilled Nursing Facility | Payer: Self-pay | Source: Ambulatory Visit | Attending: Orthopedic Surgery

## 2012-08-26 ENCOUNTER — Encounter (HOSPITAL_COMMUNITY): Payer: Self-pay | Admitting: Certified Registered Nurse Anesthetist

## 2012-08-26 DIAGNOSIS — Z951 Presence of aortocoronary bypass graft: Secondary | ICD-10-CM

## 2012-08-26 DIAGNOSIS — I350 Nonrheumatic aortic (valve) stenosis: Secondary | ICD-10-CM | POA: Diagnosis present

## 2012-08-26 DIAGNOSIS — M6281 Muscle weakness (generalized): Secondary | ICD-10-CM | POA: Diagnosis not present

## 2012-08-26 DIAGNOSIS — D62 Acute posthemorrhagic anemia: Secondary | ICD-10-CM | POA: Diagnosis not present

## 2012-08-26 DIAGNOSIS — Z7189 Other specified counseling: Secondary | ICD-10-CM

## 2012-08-26 DIAGNOSIS — M1710 Unilateral primary osteoarthritis, unspecified knee: Principal | ICD-10-CM | POA: Diagnosis present

## 2012-08-26 DIAGNOSIS — M170 Bilateral primary osteoarthritis of knee: Secondary | ICD-10-CM

## 2012-08-26 DIAGNOSIS — IMO0002 Reserved for concepts with insufficient information to code with codable children: Secondary | ICD-10-CM | POA: Diagnosis not present

## 2012-08-26 DIAGNOSIS — Z79899 Other long term (current) drug therapy: Secondary | ICD-10-CM

## 2012-08-26 DIAGNOSIS — E785 Hyperlipidemia, unspecified: Secondary | ICD-10-CM | POA: Diagnosis present

## 2012-08-26 DIAGNOSIS — Z7902 Long term (current) use of antithrombotics/antiplatelets: Secondary | ICD-10-CM

## 2012-08-26 DIAGNOSIS — M25569 Pain in unspecified knee: Secondary | ICD-10-CM | POA: Diagnosis not present

## 2012-08-26 DIAGNOSIS — Z7982 Long term (current) use of aspirin: Secondary | ICD-10-CM | POA: Diagnosis not present

## 2012-08-26 DIAGNOSIS — E7800 Pure hypercholesterolemia, unspecified: Secondary | ICD-10-CM | POA: Diagnosis present

## 2012-08-26 DIAGNOSIS — E669 Obesity, unspecified: Secondary | ICD-10-CM | POA: Diagnosis present

## 2012-08-26 DIAGNOSIS — M1990 Unspecified osteoarthritis, unspecified site: Secondary | ICD-10-CM | POA: Diagnosis not present

## 2012-08-26 DIAGNOSIS — E782 Mixed hyperlipidemia: Secondary | ICD-10-CM | POA: Diagnosis not present

## 2012-08-26 DIAGNOSIS — Z471 Aftercare following joint replacement surgery: Secondary | ICD-10-CM | POA: Diagnosis not present

## 2012-08-26 DIAGNOSIS — Z954 Presence of other heart-valve replacement: Secondary | ICD-10-CM | POA: Diagnosis not present

## 2012-08-26 DIAGNOSIS — E789 Disorder of lipoprotein metabolism, unspecified: Secondary | ICD-10-CM

## 2012-08-26 DIAGNOSIS — I10 Essential (primary) hypertension: Secondary | ICD-10-CM | POA: Diagnosis not present

## 2012-08-26 DIAGNOSIS — Z96659 Presence of unspecified artificial knee joint: Secondary | ICD-10-CM | POA: Diagnosis not present

## 2012-08-26 DIAGNOSIS — R269 Unspecified abnormalities of gait and mobility: Secondary | ICD-10-CM | POA: Diagnosis not present

## 2012-08-26 DIAGNOSIS — I2510 Atherosclerotic heart disease of native coronary artery without angina pectoris: Secondary | ICD-10-CM | POA: Diagnosis present

## 2012-08-26 DIAGNOSIS — R279 Unspecified lack of coordination: Secondary | ICD-10-CM | POA: Diagnosis not present

## 2012-08-26 DIAGNOSIS — Z4789 Encounter for other orthopedic aftercare: Secondary | ICD-10-CM | POA: Diagnosis not present

## 2012-08-26 DIAGNOSIS — E119 Type 2 diabetes mellitus without complications: Secondary | ICD-10-CM

## 2012-08-26 DIAGNOSIS — Z01818 Encounter for other preprocedural examination: Secondary | ICD-10-CM | POA: Diagnosis not present

## 2012-08-26 DIAGNOSIS — Z952 Presence of prosthetic heart valve: Secondary | ICD-10-CM

## 2012-08-26 DIAGNOSIS — R079 Chest pain, unspecified: Secondary | ICD-10-CM | POA: Diagnosis not present

## 2012-08-26 HISTORY — PX: TOTAL KNEE ARTHROPLASTY: SHX125

## 2012-08-26 LAB — GLUCOSE, CAPILLARY
Glucose-Capillary: 163 mg/dL — ABNORMAL HIGH (ref 70–99)
Glucose-Capillary: 289 mg/dL — ABNORMAL HIGH (ref 70–99)

## 2012-08-26 SURGERY — ARTHROPLASTY, KNEE, TOTAL
Anesthesia: General | Site: Knee | Laterality: Right | Wound class: Clean

## 2012-08-26 MED ORDER — HYDROCHLOROTHIAZIDE 25 MG PO TABS
25.0000 mg | ORAL_TABLET | Freq: Every day | ORAL | Status: DC
  Administered 2012-08-29: 25 mg via ORAL
  Filled 2012-08-26 (×4): qty 1

## 2012-08-26 MED ORDER — ACETAMINOPHEN 325 MG PO TABS
650.0000 mg | ORAL_TABLET | Freq: Four times a day (QID) | ORAL | Status: DC | PRN
  Administered 2012-08-28: 650 mg via ORAL
  Filled 2012-08-26: qty 2

## 2012-08-26 MED ORDER — CEFAZOLIN SODIUM-DEXTROSE 2-3 GM-% IV SOLR
2.0000 g | Freq: Four times a day (QID) | INTRAVENOUS | Status: AC
  Administered 2012-08-26 (×2): 2 g via INTRAVENOUS
  Filled 2012-08-26: qty 50

## 2012-08-26 MED ORDER — SODIUM CHLORIDE 0.9 % IJ SOLN
INTRAMUSCULAR | Status: AC
  Filled 2012-08-26: qty 12

## 2012-08-26 MED ORDER — BUPIVACAINE HCL (PF) 0.25 % IJ SOLN
INTRAMUSCULAR | Status: AC
  Filled 2012-08-26: qty 30

## 2012-08-26 MED ORDER — GLYCOPYRROLATE 0.2 MG/ML IJ SOLN
INTRAMUSCULAR | Status: DC | PRN
  Administered 2012-08-26: .8 mg via INTRAVENOUS

## 2012-08-26 MED ORDER — MORPHINE SULFATE 2 MG/ML IJ SOLN
2.0000 mg | INTRAMUSCULAR | Status: DC | PRN
  Administered 2012-08-26 – 2012-08-27 (×4): 2 mg via INTRAVENOUS
  Filled 2012-08-26 (×4): qty 1

## 2012-08-26 MED ORDER — PHENOL 1.4 % MT LIQD: 1.0000 | OROMUCOSAL | Status: DC | PRN

## 2012-08-26 MED ORDER — CEFAZOLIN SODIUM-DEXTROSE 2-3 GM-% IV SOLR: 2.0000 g | Freq: Four times a day (QID) | INTRAVENOUS | Status: DC

## 2012-08-26 MED ORDER — ASPIRIN EC 325 MG PO TBEC
325.0000 mg | DELAYED_RELEASE_TABLET | Freq: Every day | ORAL | Status: DC
  Administered 2012-08-27 – 2012-08-29 (×3): 325 mg via ORAL
  Filled 2012-08-26 (×4): qty 1

## 2012-08-26 MED ORDER — OXYCODONE HCL 5 MG PO TABS
ORAL_TABLET | ORAL | Status: AC
  Filled 2012-08-26: qty 1

## 2012-08-26 MED ORDER — SODIUM CHLORIDE 0.9 % IR SOLN
Status: DC | PRN
  Administered 2012-08-26: 3000 mL

## 2012-08-26 MED ORDER — DEXTROSE-NACL 5-0.45 % IV SOLN
INTRAVENOUS | Status: AC
  Administered 2012-08-26: 18:00:00 via INTRAVENOUS

## 2012-08-26 MED ORDER — METOCLOPRAMIDE HCL 10 MG PO TABS: 5.0000 mg | ORAL_TABLET | Freq: Three times a day (TID) | ORAL | Status: DC | PRN

## 2012-08-26 MED ORDER — BUPIVACAINE LIPOSOME 1.3 % IJ SUSP
20.0000 mL | INTRAMUSCULAR | Status: DC
  Filled 2012-08-26: qty 20

## 2012-08-26 MED ORDER — PROPOFOL 10 MG/ML IV BOLUS
INTRAVENOUS | Status: DC | PRN
  Administered 2012-08-26: 120 mg via INTRAVENOUS

## 2012-08-26 MED ORDER — IRBESARTAN 300 MG PO TABS
300.0000 mg | ORAL_TABLET | Freq: Every day | ORAL | Status: DC
  Administered 2012-08-28 – 2012-08-29 (×2): 300 mg via ORAL
  Filled 2012-08-26 (×4): qty 1

## 2012-08-26 MED ORDER — MIDAZOLAM BOLUS VIA INFUSION: 1.0000 mg | Freq: Once | INTRAVENOUS | Status: DC

## 2012-08-26 MED ORDER — FENTANYL CITRATE 0.05 MG/ML IJ SOLN
INTRAMUSCULAR | Status: DC | PRN
Start: 2012-08-26 — End: 2012-08-26
  Administered 2012-08-26 (×3): 50 ug via INTRAVENOUS
  Administered 2012-08-26 (×2): 100 ug via INTRAVENOUS

## 2012-08-26 MED ORDER — HYDROMORPHONE HCL PF 1 MG/ML IJ SOLN
0.2500 mg | INTRAMUSCULAR | Status: DC | PRN
  Administered 2012-08-26 (×4): 0.5 mg via INTRAVENOUS

## 2012-08-26 MED ORDER — LACTATED RINGERS IV SOLN
INTRAVENOUS | Status: DC | PRN
  Administered 2012-08-26 (×2): via INTRAVENOUS

## 2012-08-26 MED ORDER — ROCURONIUM BROMIDE 100 MG/10ML IV SOLN
INTRAVENOUS | Status: DC | PRN
  Administered 2012-08-26: 30 mg via INTRAVENOUS

## 2012-08-26 MED ORDER — METOCLOPRAMIDE HCL 5 MG/ML IJ SOLN
INTRAMUSCULAR | Status: DC | PRN
  Administered 2012-08-26: 10 mg via INTRAVENOUS

## 2012-08-26 MED ORDER — MENTHOL 3 MG MT LOZG
1.0000 | LOZENGE | OROMUCOSAL | Status: DC | PRN
Start: 2012-08-26 — End: 2012-08-29

## 2012-08-26 MED ORDER — LACTATED RINGERS IV SOLN: INTRAVENOUS | Status: DC

## 2012-08-26 MED ORDER — VALSARTAN-HYDROCHLOROTHIAZIDE 320-25 MG PO TABS: 1.0000 | ORAL_TABLET | Freq: Every day | ORAL | Status: DC

## 2012-08-26 MED ORDER — ACETAMINOPHEN 500 MG PO TABS
1000.0000 mg | ORAL_TABLET | Freq: Four times a day (QID) | ORAL | Status: AC
  Administered 2012-08-26 – 2012-08-27 (×4): 1000 mg via ORAL
  Filled 2012-08-26 (×4): qty 2

## 2012-08-26 MED ORDER — ONDANSETRON HCL 4 MG/2ML IJ SOLN: 4.0000 mg | Freq: Once | INTRAMUSCULAR | Status: DC | PRN

## 2012-08-26 MED ORDER — ONDANSETRON HCL 4 MG/2ML IJ SOLN: 4.0000 mg | Freq: Four times a day (QID) | INTRAMUSCULAR | Status: DC | PRN

## 2012-08-26 MED ORDER — CEFAZOLIN SODIUM-DEXTROSE 2-3 GM-% IV SOLR
2.0000 g | Freq: Four times a day (QID) | INTRAVENOUS | Status: DC
  Filled 2012-08-26 (×2): qty 50

## 2012-08-26 MED ORDER — HYDROMORPHONE HCL PF 1 MG/ML IJ SOLN
INTRAMUSCULAR | Status: AC
  Filled 2012-08-26: qty 1

## 2012-08-26 MED ORDER — METOPROLOL TARTRATE 25 MG PO TABS
25.0000 mg | ORAL_TABLET | Freq: Two times a day (BID) | ORAL | Status: DC
  Administered 2012-08-26 – 2012-08-29 (×5): 25 mg via ORAL
  Filled 2012-08-26 (×8): qty 1

## 2012-08-26 MED ORDER — MIDAZOLAM HCL 2 MG/2ML IJ SOLN: 1.0000 mg | Freq: Once | INTRAMUSCULAR | Status: DC

## 2012-08-26 MED ORDER — NEOSTIGMINE METHYLSULFATE 1 MG/ML IJ SOLN
INTRAMUSCULAR | Status: DC | PRN
  Administered 2012-08-26: 5 mg via INTRAVENOUS

## 2012-08-26 MED ORDER — METOCLOPRAMIDE HCL 5 MG/ML IJ SOLN: 5.0000 mg | Freq: Three times a day (TID) | INTRAMUSCULAR | Status: DC | PRN

## 2012-08-26 MED ORDER — CLOPIDOGREL BISULFATE 75 MG PO TABS
75.0000 mg | ORAL_TABLET | Freq: Every day | ORAL | Status: DC
  Administered 2012-08-27 – 2012-08-29 (×3): 75 mg via ORAL
  Filled 2012-08-26 (×4): qty 1

## 2012-08-26 MED ORDER — METFORMIN HCL 500 MG PO TABS
1000.0000 mg | ORAL_TABLET | Freq: Two times a day (BID) | ORAL | Status: DC
  Administered 2012-08-26 – 2012-08-29 (×6): 1000 mg via ORAL
  Filled 2012-08-26 (×8): qty 2

## 2012-08-26 MED ORDER — EPHEDRINE SULFATE 50 MG/ML IJ SOLN
INTRAMUSCULAR | Status: DC | PRN
  Administered 2012-08-26: 10 mg via INTRAVENOUS

## 2012-08-26 MED ORDER — ACETAMINOPHEN 650 MG RE SUPP: 650.0000 mg | Freq: Four times a day (QID) | RECTAL | Status: DC | PRN

## 2012-08-26 MED ORDER — BUPIVACAINE LIPOSOME 1.3 % IJ SUSP
INTRAMUSCULAR | Status: DC | PRN
  Administered 2012-08-26: 20 mL

## 2012-08-26 MED ORDER — METOPROLOL TARTRATE 12.5 MG HALF TABLET
ORAL_TABLET | ORAL | Status: AC
  Filled 2012-08-26: qty 2

## 2012-08-26 MED ORDER — LIDOCAINE HCL (CARDIAC) 20 MG/ML IV SOLN
INTRAVENOUS | Status: DC | PRN
  Administered 2012-08-26: 65 mg via INTRAVENOUS

## 2012-08-26 MED ORDER — ONDANSETRON HCL 4 MG/2ML IJ SOLN
INTRAMUSCULAR | Status: DC | PRN
  Administered 2012-08-26: 4 mg via INTRAVENOUS

## 2012-08-26 MED ORDER — ONDANSETRON HCL 4 MG PO TABS: 4.0000 mg | ORAL_TABLET | Freq: Four times a day (QID) | ORAL | Status: DC | PRN

## 2012-08-26 MED ORDER — CHLORHEXIDINE GLUCONATE 4 % EX LIQD: 60.0000 mL | Freq: Once | CUTANEOUS | Status: DC

## 2012-08-26 MED ORDER — SODIUM CHLORIDE 0.9 % IJ SOLN
INTRAMUSCULAR | Status: DC | PRN
  Administered 2012-08-26: 40 mL via INTRAVENOUS

## 2012-08-26 MED ORDER — OXYCODONE HCL 5 MG PO TABS
5.0000 mg | ORAL_TABLET | ORAL | Status: DC | PRN
  Administered 2012-08-26 (×2): 10 mg via ORAL
  Administered 2012-08-26: 5 mg via ORAL
  Administered 2012-08-27 – 2012-08-29 (×8): 10 mg via ORAL
  Filled 2012-08-26 (×10): qty 2

## 2012-08-26 MED ORDER — METOPROLOL TARTRATE 25 MG PO TABS
25.0000 mg | ORAL_TABLET | Freq: Once | ORAL | Status: AC
  Administered 2012-08-26: 25 mg via ORAL

## 2012-08-26 MED ORDER — MIDAZOLAM HCL 2 MG/2ML IJ SOLN
INTRAMUSCULAR | Status: AC
  Administered 2012-08-26: 1 mg via INTRAVENOUS
  Filled 2012-08-26: qty 2

## 2012-08-26 MED ORDER — EZETIMIBE 10 MG PO TABS
10.0000 mg | ORAL_TABLET | Freq: Every day | ORAL | Status: DC
  Administered 2012-08-26 – 2012-08-29 (×4): 10 mg via ORAL
  Filled 2012-08-26 (×4): qty 1

## 2012-08-26 MED ORDER — ARTIFICIAL TEARS OP OINT
TOPICAL_OINTMENT | OPHTHALMIC | Status: DC | PRN
  Administered 2012-08-26: 1 via OPHTHALMIC

## 2012-08-26 MED ORDER — SODIUM CHLORIDE 0.9 % IR SOLN
Status: DC | PRN
  Administered 2012-08-26: 1000 mL

## 2012-08-26 MED ORDER — FENOFIBRATE 54 MG PO TABS
54.0000 mg | ORAL_TABLET | Freq: Every day | ORAL | Status: DC
  Administered 2012-08-26 – 2012-08-29 (×4): 54 mg via ORAL
  Filled 2012-08-26 (×4): qty 1

## 2012-08-26 MED ORDER — BUPIVACAINE HCL (PF) 0.25 % IJ SOLN
INTRAMUSCULAR | Status: DC | PRN
  Administered 2012-08-26: 20 mL

## 2012-08-26 SURGICAL SUPPLY — 59 items
BANDAGE ELASTIC 4 VELCRO ST LF (GAUZE/BANDAGES/DRESSINGS) ×1 IMPLANT
BANDAGE ELASTIC 6 VELCRO ST LF (GAUZE/BANDAGES/DRESSINGS) ×1 IMPLANT
BANDAGE ESMARK 6X9 LF (GAUZE/BANDAGES/DRESSINGS) ×1 IMPLANT
BLADE SAG 18X100X1.27 (BLADE) ×4 IMPLANT
BLADE SURG 10 STRL SS (BLADE) ×1 IMPLANT
BNDG CMPR 9X6 STRL LF SNTH (GAUZE/BANDAGES/DRESSINGS) ×1
BNDG ESMARK 6X9 LF (GAUZE/BANDAGES/DRESSINGS) ×2
BOWL SMART MIX CTS (DISPOSABLE) ×2 IMPLANT
CEMENT BONE SIMPLEX SPEEDSET (Cement) ×4 IMPLANT
CLOTH BEACON ORANGE TIMEOUT ST (SAFETY) ×2 IMPLANT
COVER SURGICAL LIGHT HANDLE (MISCELLANEOUS) ×2 IMPLANT
CUFF TOURNIQUET SINGLE 34IN LL (TOURNIQUET CUFF) ×2 IMPLANT
DRAPE EXTREMITY T 121X128X90 (DRAPE) ×2 IMPLANT
DRAPE PROXIMA HALF (DRAPES) ×2 IMPLANT
DRAPE U-SHAPE 47X51 STRL (DRAPES) ×2 IMPLANT
DRSG PAD ABDOMINAL 8X10 ST (GAUZE/BANDAGES/DRESSINGS) ×2 IMPLANT
DURAPREP 26ML APPLICATOR (WOUND CARE) ×2 IMPLANT
ELECT CAUTERY BLADE 6.4 (BLADE) ×2 IMPLANT
ELECT REM PT RETURN 9FT ADLT (ELECTROSURGICAL) ×2
ELECTRODE REM PT RTRN 9FT ADLT (ELECTROSURGICAL) ×1 IMPLANT
EVACUATOR 1/8 PVC DRAIN (DRAIN) ×2 IMPLANT
FACESHIELD LNG OPTICON STERILE (SAFETY) ×2 IMPLANT
GAUZE XEROFORM 5X9 LF (GAUZE/BANDAGES/DRESSINGS) ×2 IMPLANT
GLOVE ORTHO TXT STRL SZ7.5 (GLOVE) ×2 IMPLANT
GOWN PREVENTION PLUS XLARGE (GOWN DISPOSABLE) ×4 IMPLANT
GOWN STRL NON-REIN LRG LVL3 (GOWN DISPOSABLE) ×4 IMPLANT
HANDPIECE INTERPULSE COAX TIP (DISPOSABLE) ×2
IMMOBILIZER KNEE 22 UNIV (SOFTGOODS) ×2 IMPLANT
IMMOBILIZER KNEE 24 THIGH 36 (MISCELLANEOUS) IMPLANT
IMMOBILIZER KNEE 24 UNIV (MISCELLANEOUS)
KIT BASIN OR (CUSTOM PROCEDURE TRAY) ×2 IMPLANT
KIT ROOM TURNOVER OR (KITS) ×2 IMPLANT
KNEE ORTHOSENSOR TRIATH SZ 5 (DISPOSABLE) ×1 IMPLANT
KNEE/VIT E POLY LINER LEVEL 1B ×1 IMPLANT
MANIFOLD NEPTUNE II (INSTRUMENTS) ×2 IMPLANT
NDL 18GX1X1/2 (RX/OR ONLY) (NEEDLE) ×1 IMPLANT
NDL HYPO 25GX1X1/2 BEV (NEEDLE) ×1 IMPLANT
NEEDLE 18GX1X1/2 (RX/OR ONLY) (NEEDLE) ×2 IMPLANT
NEEDLE HYPO 22GX1.5 SAFETY (NEEDLE) ×1 IMPLANT
NEEDLE HYPO 25GX1X1/2 BEV (NEEDLE) ×2 IMPLANT
NS IRRIG 1000ML POUR BTL (IV SOLUTION) ×2 IMPLANT
PACK TOTAL JOINT (CUSTOM PROCEDURE TRAY) ×2 IMPLANT
PAD ARMBOARD 7.5X6 YLW CONV (MISCELLANEOUS) ×4 IMPLANT
PAD CAST 4YDX4 CTTN HI CHSV (CAST SUPPLIES) ×1 IMPLANT
PADDING CAST COTTON 4X4 STRL (CAST SUPPLIES) ×2
PADDING CAST COTTON 6X4 STRL (CAST SUPPLIES) ×2 IMPLANT
SET HNDPC FAN SPRY TIP SCT (DISPOSABLE) ×1 IMPLANT
SPONGE GAUZE 4X4 12PLY (GAUZE/BANDAGES/DRESSINGS) ×2 IMPLANT
STAPLER VISISTAT 35W (STAPLE) ×2 IMPLANT
SUCTION FRAZIER TIP 10 FR DISP (SUCTIONS) ×2 IMPLANT
SUT VIC AB 1 CTX 36 (SUTURE) ×4
SUT VIC AB 1 CTX36XBRD ANBCTR (SUTURE) ×2 IMPLANT
SUT VIC AB 2-0 CT1 27 (SUTURE) ×4
SUT VIC AB 2-0 CT1 TAPERPNT 27 (SUTURE) ×2 IMPLANT
SYR CONTROL 10ML LL (SYRINGE) ×2 IMPLANT
TOWEL OR 17X24 6PK STRL BLUE (TOWEL DISPOSABLE) ×2 IMPLANT
TOWEL OR 17X26 10 PK STRL BLUE (TOWEL DISPOSABLE) ×2 IMPLANT
TRAY FOLEY CATH 16FRSI W/METER (SET/KITS/TRAYS/PACK) ×2 IMPLANT
WATER STERILE IRR 1000ML POUR (IV SOLUTION) ×4 IMPLANT

## 2012-08-26 NOTE — Anesthesia Preprocedure Evaluation (Signed)
Anesthesia Evaluation  Patient identified by MRN, date of birth, ID band Patient awake    Reviewed: Allergy & Precautions, H&P , NPO status , Patient's Chart, lab work & pertinent test results, reviewed documented beta blocker date and time   Airway Mallampati: II      Dental  (+) Partial Upper and Dental Advisory Given   Pulmonary  breath sounds clear to auscultation        Cardiovascular Rhythm:Regular Rate:Normal     Neuro/Psych    GI/Hepatic   Endo/Other    Renal/GU      Musculoskeletal   Abdominal   Peds  Hematology   Anesthesia Other Findings   Reproductive/Obstetrics                           Anesthesia Physical Anesthesia Plan  ASA: III  Anesthesia Plan: General   Post-op Pain Management:    Induction: Intravenous  Airway Management Planned: LMA  Additional Equipment:   Intra-op Plan:   Post-operative Plan:   Informed Consent: I have reviewed the patients History and Physical, chart, labs and discussed the procedure including the risks, benefits and alternatives for the proposed anesthesia with the patient or authorized representative who has indicated his/her understanding and acceptance.   Dental advisory given  Plan Discussed with: CRNA and Anesthesiologist  Anesthesia Plan Comments: (DJD r. Knee Htn Type 2 DM CAD S/P AVR, CABG  Plan GA with oral ETT  Kipp Brood)        Anesthesia Quick Evaluation

## 2012-08-26 NOTE — Evaluation (Signed)
Physical Therapy Evaluation Patient Details Name: [name] [name] MRN: [id number] DOB: 08-Dec-1934 Today's Date: 08/26/2012 Time: 6213-0865 PT Time Calculation (min): 24 min  PT Assessment / Plan / Recommendation History of Present Illness  s/p elective Rt TKA   Clinical Impression  Pt is s/p Rt TKA POD#0 resulting in the deficits listed below (see PT Problem List). Pt will benefit from skilled PT to increase their independence and safety with mobility to allow discharge to the venue listed below. Pt has made arrangements to D/C to [facility]. Pt to benefit from STSNF prior to returning home, pt lives alone and will need to be independent with transfers, gt and ADLs.     PT Assessment  Patient needs continued PT services    Follow Up Recommendations  SNF;Supervision/Assistance - 24 hour    Does the patient have the potential to tolerate intense rehabilitation      Barriers to Discharge Decreased caregiver support pt lives alone     Equipment Recommendations  Other (comment) (TBD at SNF)    Recommendations for Other Services     Frequency 7X/week    Precautions / Restrictions Precautions Precautions: Fall;Knee Precaution Booklet Issued: Yes (comment) Required Braces or Orthoses: Knee Immobilizer - Right Knee Immobilizer - Right: Other (comment) (not specified by MD) Restrictions Weight Bearing Restrictions: Yes RLE Weight Bearing: Weight bearing as tolerated   Pertinent Vitals/Pain 5/10      Mobility  Bed Mobility Bed Mobility: Supine to Sit;Sitting - Scoot to Edge of Bed Supine to Sit: 5: Supervision;HOB elevated;With rails Sitting - Scoot to Edge of Bed: 5: Supervision;With rail Details for Bed Mobility Assistance: pt required vc's for sequencing and hand placement; pt did not require physical (A) today; required HOB elevate and handrails  Transfers Transfers: Sit to Stand;Stand to Sit Sit to Stand: 4: Min guard;From bed Stand to Sit: 4: Min guard;To  chair/3-in-1;With armrests Details for Transfer Assistance: min guard to maintain balance with transfers; vc's for hand placement and safety with RW Ambulation/Gait Ambulation/Gait Assistance: 4: Min assist;4: Min guard Ambulation Distance (Feet): 10 Feet Assistive device: Rolling walker Ambulation/Gait Assistance Details: vc's for gt sequencing; pt requires min (A) to manage RW with turning; pt amb with decreased stride length and fwd flexed posture  Gait Pattern: Step-to pattern;Decreased stance time - right;Decreased step length - left;Trunk flexed Gait velocity: decreased Stairs: No Wheelchair Mobility Wheelchair Mobility: No    Exercises Total Joint Exercises Ankle Circles/Pumps: AROM;Both;15 reps;Supine   PT Diagnosis: Difficulty walking;Acute pain  PT Problem List: Decreased strength;Decreased range of motion;Decreased balance;Decreased mobility;Decreased knowledge of use of DME;Pain PT Treatment Interventions: DME instruction;Gait training;Functional mobility training;Therapeutic activities;Therapeutic exercise;Balance training;Neuromuscular re-education;Patient/family education     PT Goals(Current goals can be found in the care plan section) Acute Rehab PT Goals Patient Stated Goal: to go to [facility] and get stronger  PT Goal Formulation: With patient Time For Goal Achievement: 09/02/12 Potential to Achieve Goals: Good  Visit Information  Last PT Received On: 08/26/12 Assistance Needed: +1 History of Present Illness: s/p elective Rt TKA        Prior Functioning  Home Living Family/patient expects to be discharged to:: Skilled nursing facility Living Arrangements: Alone Additional Comments: Pt has made arrangements to D/C to [facility]  Prior Function Level of Independence: Independent Communication Communication: No difficulties    Cognition  Cognition Arousal/Alertness: Awake/alert Behavior During Therapy: WFL for tasks assessed/performed Overall Cognitive  Status: Within Functional Limits for tasks assessed    Extremity/Trunk Assessment Upper Extremity  Assessment Upper Extremity Assessment: Defer to OT evaluation Lower Extremity Assessment Lower Extremity Assessment: RLE deficits/detail RLE Deficits / Details: grossly 2+/5 in knee; limited MMT by pain ;  RLE: Unable to fully assess due to pain RLE Sensation:  (WFL to light touch at ankle ) Cervical / Trunk Assessment Cervical / Trunk Assessment: Normal   Balance Balance Balance Assessed: Yes Static Sitting Balance Static Sitting - Balance Support: Bilateral upper extremity supported;Feet unsupported Static Sitting - Level of Assistance: 5: Stand by assistance Static Sitting - Comment/# of Minutes: tolerated sitting EOB ~46min to donn new gown   End of Session PT - End of Session Equipment Utilized During Treatment: Gait belt;Right knee immobilizer Activity Tolerance: Patient tolerated treatment well Patient left: in chair;with call bell/phone within reach;with family/visitor present Nurse Communication: Mobility status CPM Right Knee CPM Right Knee: Off Right Knee Flexion (Degrees): 60 Right Knee Extension (Degrees): 0 Additional Comments: trapeze bar  GP     Donell Sievert, [place] [phone] 08/26/2012, 3:16 PM

## 2012-08-26 NOTE — Transfer of Care (Signed)
Immediate Anesthesia Transfer of Care Note  Patient: [name] [name]  Procedure(s) Performed: Procedure(s): TOTAL KNEE ARTHROPLASTY (Right)  Patient Location: PACU  Anesthesia Type:General  Level of Consciousness: awake and patient cooperative  Airway & Oxygen Therapy: Patient Spontanous Breathing and Patient connected to face mask oxygen  Post-op Assessment: Report given to PACU RN, Post -op Vital signs reviewed and stable and Patient moving all extremities X 4  Post vital signs: Reviewed and stable  Complications: No apparent anesthesia complications

## 2012-08-26 NOTE — Preoperative (Signed)
Beta Blockers   Reason not to administer Beta Blockers:Not Applicable 

## 2012-08-26 NOTE — Progress Notes (Signed)
Orthopedic Tech Progress Note Patient Details:  [name] [name] 16-Dec-1934 [id number]  CPM Right Knee CPM Right Knee: On Right Knee Flexion (Degrees): 60 Right Knee Extension (Degrees): 0 Additional Comments: trapeze bar   Cammer, Mickie Bail 08/26/2012, 12:05 PM

## 2012-08-26 NOTE — Anesthesia Postprocedure Evaluation (Signed)
  Anesthesia Post-op Note  Patient: [name] [name]  Procedure(s) Performed: Procedure(s): TOTAL KNEE ARTHROPLASTY (Right)  Patient Location: PACU  Anesthesia Type:General  Level of Consciousness: awake, alert  and oriented  Airway and Oxygen Therapy: Patient Spontanous Breathing and Patient connected to nasal cannula oxygen  Post-op Pain: mild  Post-op Assessment: Post-op Vital signs reviewed, Patient's Cardiovascular Status Stable, Respiratory Function Stable, Patent Airway and Pain level controlled  Post-op Vital Signs: stable  Complications: No apparent anesthesia complications

## 2012-08-26 NOTE — Progress Notes (Signed)
Utilization review complete. Charnae Lill RN CCM Case Mgmt phone [phone] 

## 2012-08-26 NOTE — Plan of Care (Signed)
Problem: Consults Goal: Diagnosis- Total Joint Replacement Primary Total Knee Right     

## 2012-08-26 NOTE — Interval H&P Note (Signed)
History and Physical Interval Note:  08/26/2012 8:12 AM  [name] [name]  has presented today for surgery, with the diagnosis of DJD RIGHT KNEE  The various methods of treatment have been discussed with the patient and family. After consideration of risks, benefits and other options for treatment, the patient has consented to  Procedure(s): TOTAL KNEE ARTHROPLASTY (Right) as a surgical intervention .  The patient's history has been reviewed, patient examined, no change in status, stable for surgery.  I have reviewed the patient's chart and labs.  Questions were answered to the patient's satisfaction.     Janani Chamber F

## 2012-08-27 ENCOUNTER — Encounter (HOSPITAL_COMMUNITY): Payer: Self-pay | Admitting: Orthopedic Surgery

## 2012-08-27 LAB — GLUCOSE, CAPILLARY
Glucose-Capillary: 165 mg/dL — ABNORMAL HIGH (ref 70–99)
Glucose-Capillary: 140 mg/dL — ABNORMAL HIGH (ref 70–99)

## 2012-08-27 LAB — PREPARE RBC (CROSSMATCH)

## 2012-08-27 LAB — HEMOGLOBIN AND HEMATOCRIT, BLOOD: HCT: 22.3 % — ABNORMAL LOW (ref 36.0–46.0)

## 2012-08-27 MED ORDER — INSULIN ASPART 100 UNIT/ML ~~LOC~~ SOLN: 0.0000 [IU] | Freq: Every day | SUBCUTANEOUS | Status: DC

## 2012-08-27 MED ORDER — INSULIN ASPART 100 UNIT/ML ~~LOC~~ SOLN
0.0000 [IU] | Freq: Three times a day (TID) | SUBCUTANEOUS | Status: DC
  Administered 2012-08-28: 3 [IU] via SUBCUTANEOUS
  Administered 2012-08-28 – 2012-08-29 (×3): 2 [IU] via SUBCUTANEOUS

## 2012-08-27 NOTE — Progress Notes (Signed)
Inpatient Diabetes Program Recommendations  AACE/ADA: New Consensus Statement on Inpatient Glycemic Control (2013)  Target Ranges:  Prepandial:   less than 140 mg/dL      Peak postprandial:   less than 180 mg/dL (1-2 hours)      Critically ill patients:  140 - 180 mg/dL   Results for [name] [name], [name] [name] (MRN [id number]) as of 08/27/2012 08:33  Ref. Range 08/26/2012 07:03 08/26/2012 11:14 08/26/2012 16:19 08/26/2012 22:22 08/27/2012 06:40  Glucose-Capillary Latest Range: 70-99 mg/dL 409 (H) 811 (H) 914 (H) 289 (H) 196 (H)    Inpatient Diabetes Program Recommendations Correction (SSI): Please order Novolog correction scale ACHS. (There is a nursing order for MD to be called if patient has CBG greater than 140 mg/dl.  Blood gluocse up to 289 mg/dl on 7/82/95).  Note: Patient has a history of diabetes and takes Metformin 1000 mg BID at home for diabetes management.  Currently, patient is ordered to receive Metformin 1000 mg BID for inpatient glycemic control.  CBGs are being checked ACHS and there is an order for nursing to notify MD if CBG is greater than 140 mg/dl.  Blood glucose up to 289 mg/dl yesterday.  NURSING:  Please notify MD and request inpatient glycemic control order set.  Please order Novolog correction scale ACHS.  Will continue to follow.  Thanks Orlando Penner, RN, MSN, CCRN Diabetes Coordinator Inpatient Diabetes Program [phone]

## 2012-08-27 NOTE — Op Note (Signed)
NAMEMarland Kitchen  [name] [name], [name] [name] NO.:  [id number]  MEDICAL RECORD NO.:  [id number]  LOCATION:  5N09C                        FACILITY:  [facility]  PHYSICIAN:  Loreta Ave, M.D. DATE OF BIRTH:  06-26-1934  DATE OF PROCEDURE:  08/26/2012 DATE OF DISCHARGE:                              OPERATIVE REPORT   PREOPERATIVE DIAGNOSES:  Right knee end-stage degenerative arthritis. Marked destructive changes with significant fixed varus and flexion contracture.  POSTOPERATIVE DIAGNOSES:  Right knee end-stage degenerative arthritis. Marked destructive changes with significant fixed varus and flexion contracture.  PROCEDURE:  Right modified minimally invasive total knee replacement Stryker Triathlon prosthesis.  Soft tissue balancing.  Computer assist with OrthoSensor device.  Cemented pegged posterior stabilized #4 femoral component.  Cemented #5 tibial component 9-mm polyethylene insert.  Cemented resurfacing 32-mm patellar component.  SURGEON:  Loreta Ave, M.D.  ASSISTANT:  Margarita Rana, MD, present throughout the entire case and necessary for timely completion of procedure.  ANESTHESIA:  General.  BLOOD LOSS:  Minimal.  SPECIMENS:  None.  CULTURES:  None.  COMPLICATIONS:  None.  DRESSINGS:  Soft compressive, knee immobilizer.  DRAINS:  Hemovac x1.  TOURNIQUET TIME:  1 hour and 15 minutes.  PROCEDURE:  The patient was brought to the operating room and placed on the operating table in supine position.  After adequate anesthesia had been obtained, knee examined.  A 10-degree flexion contracture and 5- degrees varus fixed.  Flexion barely 80 degrees.  Tourniquet applied. Prepped and draped in usual sterile fashion.  Exsanguinated with elevation of Esmarch.  Tourniquet was inflated to 350 mmHg.  Straight incision above the patella down to the tibial tubercle.  Skin and subcutaneous tissue were divided.  Medial arthrotomy and vastus splitting preserving quad  tendon.  Hemostasis with cautery.  Knee exposed.  Periarticular spurs, loose bodies, remnants of menisci, cruciate ligaments were excised.  Distal femur was exposed. Intramedullary guide was placed.  A 10-mm resection, 5 degrees of valgus.  Using epicondylar axis, the femur was sized, cut, and fitted for posterior stabilized pegged #4 component.  Proximal tibial resection, extramedullary guide.  A 3-degree posterior slope cut.  Size #5 component.  Patella was exposed.  Posterior 10 mm was removed. Drilled, sized, and fitted for a 32-mm component.  Debris cleared throughout the knee including marked spurring especially in the back. Trials were then put in place utilizing the OrthoSensor device.  I then was able to assess the knee through full motion for appropriate balancing of all compartments through full motion.  The device aided in correcting tightness medially by adjusting by tibial cut into slightly more varus.  This coupled with the soft tissue balancing I had done medially allowed for a perfectly balanced knee through full range of motion with excellent stability and good patellar tracking.  Rotation of tibial component was also set and that was hand reamed.  All trials had been removed.  Copious irrigation with a pulse irrigating device. Cement was prepared and placed on all components, firmly seated. Polyethylene attached to the tibia and knee reduced.  Patella held with clamp.  At completion, the knee was irrigated once again.  Hemovac was placed and brought out through a separate  stab wound.  Arthrotomy was closed with #1 Vicryl.  Skin and subcutaneous tissue with Vicryl and staples.  Margins were injected with Marcaine.  Prior to closure, soft tissues had been injected with Exparel.  After the dressing was applied, tourniquet removed and knee immobilizer applied.  Anesthesia was reversed.  Brought to the recovery room.  Tolerated the surgery well. No  complications.     Loreta Ave, M.D.     DFM/MEDQ  D:  08/26/2012  T:  08/27/2012  Job:  [id number]

## 2012-08-27 NOTE — Progress Notes (Signed)
Physical Therapy Treatment Patient Details Name: [name] [name] MRN: [id number] DOB: 08-31-1934 Today's Date: 08/27/2012 Time: 4098-1191 PT Time Calculation (min): 30 min  PT Assessment / Plan / Recommendation  History of Present Illness s/p elective Rt TKA    PT Comments   Pt limited in therapy today due to fatigue. Pt hgb at 7.2. Pt increased amb distance and was asymptomatic. Pt Rt LE did begin to buckle with fatigue. Pt highly motivated to get stronger and go to rehab prior to returning home, where she lives alone. Pt BP in sitting after therapy 105/31.   Follow Up Recommendations  SNF;Supervision/Assistance - 24 hour     Does the patient have the potential to tolerate intense rehabilitation     Barriers to Discharge        Equipment Recommendations  Other (comment)    Recommendations for Other Services    Frequency 7X/week   Progress towards PT Goals Progress towards PT goals: Progressing toward goals  Plan Current plan remains appropriate    Precautions / Restrictions Precautions Precautions: Fall;Knee Precaution Booklet Issued: Yes (comment) Required Braces or Orthoses: Knee Immobilizer - Right Restrictions Weight Bearing Restrictions: Yes RLE Weight Bearing: Weight bearing as tolerated   Pertinent Vitals/Pain "no pain today"     Mobility  Bed Mobility Bed Mobility: Not assessed Transfers Transfers: Sit to Stand;Stand to Sit Sit to Stand: 4: Min guard;From chair/3-in-1;With armrests;4: Min assist Stand to Sit: To chair/3-in-1;With armrests;4: Min assist Details for Transfer Assistance: pt transferred to/from recliner <> 3 in 1 <> recliner; pt required min (A) to min guard (A) for transfers to maintain balance and achieve upright standing positon; pt demo anterior LOB with standing from 3 in 1, required (A) to maintain balance; vc's for hand placement and safety  Ambulation/Gait Ambulation/Gait Assistance: 4: Min guard;4: Min assist Ambulation Distance  (Feet): 30 Feet (to bathroom; then to door ) Assistive device: Rolling walker Ambulation/Gait Assistance Details: vc's for gt sequencing and upright posture; pt with constant fwd flex posture and looking at feet during amb; pt initially required min guard but with fatigue pt Rt LE began to buckle and required min (A) to maintain balance Gait Pattern: Step-to pattern;Decreased stance time - right;Decreased step length - left;Trunk flexed Gait velocity: very decreased General Gait Details: recommend using Rt knee immobilizer for long distances due to fatigue and Rt Knee tends to buckle  Stairs: No Wheelchair Mobility Wheelchair Mobility: No    Exercises Total Joint Exercises Ankle Circles/Pumps: AROM;10 reps;Both;Seated Quad Sets: AROM;Right;10 reps;Seated Hip ABduction/ADduction: AROM;Left;10 reps;Seated Long Arc Quad: AAROM;Right;10 reps;Seated Knee Flexion: AAROM;Right;5 reps (limited by pain) Goniometric ROM: knee flexion to 60 degrees AAROM; c/o pain    PT Diagnosis:    PT Problem List:   PT Treatment Interventions:     PT Goals (current goals can now be found in the care plan section) Acute Rehab PT Goals Patient Stated Goal: to go to [facility] and get stronger  PT Goal Formulation: With patient Time For Goal Achievement: 09/02/12 Potential to Achieve Goals: Good  Visit Information  Last PT Received On: 08/27/12 Assistance Needed: +1 History of Present Illness: s/p elective Rt TKA     Subjective Data  Subjective: Pt up in chair with daughter present; agreeable to therapy. states "lets try to go to the bathroom "  Patient Stated Goal: to go to [facility] and get stronger    Cognition  Cognition Arousal/Alertness: Awake/alert Behavior During Therapy: WFL for tasks assessed/performed Overall Cognitive Status: Within Functional  Limits for tasks assessed    Balance  Balance Balance Assessed: No  End of Session PT - End of Session Equipment Utilized During Treatment: Gait  belt Activity Tolerance: Patient limited by fatigue Patient left: in chair;with call bell/phone within reach;with family/visitor present Nurse Communication: Mobility status;Other (comment) (BP )   GP     Donell Sievert, [place] [phone] 08/27/2012, 1:53 PM

## 2012-08-27 NOTE — Progress Notes (Signed)
    Subjective:  Patient reports pain as mild  Objective:   VITALS:   Filed Vitals:   08/27/12 0222 08/27/12 0400 08/27/12 0650 08/27/12 0800  BP: 118/35  112/43   Pulse: 68  73   Temp: 98.8 F (37.1 C)  98.6 F (37 C)   TempSrc: Oral  Oral   Resp: 19 16 18 18   SpO2: 99%  99%     Physical Exam  Dressing: C/D/I  Compartments soft SILT DP/SP/S/S/T, 2+DP, +TA/GS/EHL  LABS  Results for orders placed during the hospital encounter of 08/26/12 (from the past 24 hour(s))  GLUCOSE, CAPILLARY     Status: Abnormal   Collection Time    08/26/12 11:14 AM      Result Value Range   Glucose-Capillary 138 (*) 70 - 99 mg/dL  GLUCOSE, CAPILLARY     Status: Abnormal   Collection Time    08/26/12  4:19 PM      Result Value Range   Glucose-Capillary 163 (*) 70 - 99 mg/dL   Comment 1 Notify RN    GLUCOSE, CAPILLARY     Status: Abnormal   Collection Time    08/26/12 10:22 PM      Result Value Range   Glucose-Capillary 289 (*) 70 - 99 mg/dL  HEMOGLOBIN AND HEMATOCRIT, BLOOD     Status: Abnormal   Collection Time    08/27/12  6:15 AM      Result Value Range   Hemoglobin 7.2 (*) 12.0 - 15.0 g/dL   HCT 30.8 (*) 65.7 - 84.6 %  GLUCOSE, CAPILLARY     Status: Abnormal   Collection Time    08/27/12  6:40 AM      Result Value Range   Glucose-Capillary 196 (*) 70 - 99 mg/dL  HEMOGLOBIN AND HEMATOCRIT, BLOOD     Status: Abnormal   Collection Time    08/27/12  8:30 AM      Result Value Range   Hemoglobin 7.2 (*) 12.0 - 15.0 g/dL   HCT 96.2 (*) 95.2 - 84.1 %     Assessment/Plan: 1 Day Post-Op   Active Problems:   * No active hospital problems. *   PLAN: Re check Hgb, patient is asymptomatic at this time.  Weight Bearing: WBAT Dressings: keep C/D/I VTE prophylaxis: ASA 325 and plavix and SCD's Dispo: per PT/social work   Jameica Couts, Marcial Pacas, D 08/27/2012, 9:25 AM   Margarita Rana, MD Cell [phone]

## 2012-08-28 DIAGNOSIS — D62 Acute posthemorrhagic anemia: Secondary | ICD-10-CM | POA: Diagnosis not present

## 2012-08-28 LAB — GLUCOSE, CAPILLARY
Glucose-Capillary: 168 mg/dL — ABNORMAL HIGH (ref 70–99)
Glucose-Capillary: 141 mg/dL — ABNORMAL HIGH (ref 70–99)
Glucose-Capillary: 138 mg/dL — ABNORMAL HIGH (ref 70–99)
Glucose-Capillary: 136 mg/dL — ABNORMAL HIGH (ref 70–99)

## 2012-08-28 LAB — TYPE AND SCREEN
Antibody Screen: NEGATIVE
Unit division: 0

## 2012-08-28 LAB — CBC
Hemoglobin: 9.8 g/dL — ABNORMAL LOW (ref 12.0–15.0)
MCH: 26.3 pg (ref 26.0–34.0)
RBC: 3.73 MIL/uL — ABNORMAL LOW (ref 3.87–5.11)
WBC: 8.3 10*3/uL (ref 4.0–10.5)

## 2012-08-28 LAB — BASIC METABOLIC PANEL
GFR calc non Af Amer: 67 mL/min — ABNORMAL LOW (ref >90)
Glucose, Bld: 159 mg/dL — ABNORMAL HIGH (ref 70–99)
Potassium: 3.9 mEq/L (ref 3.5–5.1)
Sodium: 136 mEq/L (ref 135–145)

## 2012-08-28 MED ORDER — BISACODYL 5 MG PO TBEC: 10.0000 mg | DELAYED_RELEASE_TABLET | Freq: Every morning | ORAL | Status: DC

## 2012-08-28 MED ORDER — METFORMIN HCL 1000 MG PO TABS: 1000.0000 mg | ORAL_TABLET | Freq: Two times a day (BID) | ORAL | Status: DC

## 2012-08-28 MED ORDER — ASPIRIN 325 MG PO TBEC: 325.0000 mg | DELAYED_RELEASE_TABLET | Freq: Every day | ORAL | Status: DC

## 2012-08-28 MED ORDER — BISACODYL 5 MG PO TBEC
10.0000 mg | DELAYED_RELEASE_TABLET | Freq: Every morning | ORAL | Status: DC
  Administered 2012-08-28: 10 mg via ORAL
  Filled 2012-08-28 (×2): qty 2

## 2012-08-28 MED ORDER — DOCUSATE SODIUM 100 MG PO CAPS
100.0000 mg | ORAL_CAPSULE | Freq: Two times a day (BID) | ORAL | Status: DC
  Administered 2012-08-28 – 2012-08-29 (×3): 100 mg via ORAL
  Filled 2012-08-28 (×2): qty 1

## 2012-08-28 MED ORDER — OXYCODONE HCL 5 MG PO TABS: ORAL_TABLET | ORAL | Status: DC

## 2012-08-28 MED ORDER — DSS 100 MG PO CAPS: 100.0000 mg | ORAL_CAPSULE | Freq: Two times a day (BID) | ORAL | Status: DC

## 2012-08-28 NOTE — Progress Notes (Signed)
Subjective: 2 Days Post-Op Procedure(s) (LRB): TOTAL KNEE ARTHROPLASTY (Right) Patient reports pain as 4 on 0-10 scale.    Objective: Vital signs in last 24 hours: Temp:  [97.6 F (36.4 C)-100.5 F (38.1 C)] 100.5 F (38.1 C) (08/15 0645) Pulse Rate:  [66-96] 88 (08/15 0645) Resp:  [17-20] 18 (08/15 0645) BP: (104-162)/(42-70) 119/50 mmHg (08/15 0645) SpO2:  [95 %-99 %] 95 % (08/15 0645)  Intake/Output from previous day: 08/14 0701 - 08/15 0700 In: 890 [P.O.:870; Blood:20] Out: 100 [Drains:100] Intake/Output this shift: Total I/O In: 120 [P.O.:120] Out: -    Recent Labs  08/27/12 0615 08/27/12 0830 08/28/12 0555  HGB 7.2* 7.2* 9.8*    Recent Labs  08/27/12 0830 08/28/12 0555  WBC  --  8.3  RBC  --  3.73*  HCT 22.3* 29.9*  PLT  --  157   No results found for this basename: NA, K, CL, CO2, BUN, CREATININE, GLUCOSE, CALCIUM,  in the last 72 hours No results found for this basename: LABPT, INR,  in the last 72 hours  ABD soft Neurovascular intact Sensation intact distally Intact pulses distally Dorsiflexion/Plantar flexion intact Incision: scant drainage  Assessment/Plan: 2 Days Post-Op Procedure(s) (LRB): TOTAL KNEE ARTHROPLASTY (Right) Present on Admission:  . Hypertension . Aortic stenosis . Hypercholesterolemia . Diabetes mellitus type II, non insulin dependent . CAD (coronary artery disease) . OA (osteoarthritis) . Hyperlipidemia . Degenerative arthritis of knee Post op blood loss anemia is improved following transfusion of 2 units of PRBCs yesterday. Up with therapy D/C IV fluids Plan for discharge tomorrow to [facility] pending.  Mehul Rudin J 08/28/2012, 6:49 AM

## 2012-08-28 NOTE — Progress Notes (Signed)
Physical Therapy Treatment Patient Details Name: [name] [name] MRN: [id number] DOB: 1934-11-09 Today's Date: 08/28/2012 Time: 0940-1006 PT Time Calculation (min): 26 min  PT Assessment / Plan / Recommendation  History of Present Illness s/p elective Rt TKA    PT Comments   Patient progressing and feeling much better today. Awaiting DC to SNF to increase functional independence prior to returning home  Follow Up Recommendations  SNF;Supervision/Assistance - 24 hour     Does the patient have the potential to tolerate intense rehabilitation     Barriers to Discharge        Equipment Recommendations       Recommendations for Other Services    Frequency 7X/week   Progress towards PT Goals Progress towards PT goals: Progressing toward goals  Plan Current plan remains appropriate    Precautions / Restrictions Precautions Precautions: Fall;Knee Required Braces or Orthoses: Knee Immobilizer - Right Restrictions RLE Weight Bearing: Weight bearing as tolerated   Pertinent Vitals/Pain no apparent distress    Mobility  Bed Mobility Bed Mobility: Not assessed Transfers Sit to Stand: 4: Min guard;From chair/3-in-1;With armrests Stand to Sit: To chair/3-in-1;With armrests;4: Min guard Details for Transfer Assistance: Cues for safe hand placement and to control descent into recliner Ambulation/Gait Ambulation/Gait Assistance: 4: Min guard Ambulation Distance (Feet): 100 Feet Assistive device: Rolling walker Ambulation/Gait Assistance Details: Cues for upright posture and to look forward. Patient starting to ambulate with small step through pattern Gait Pattern: Step-through pattern;Decreased stride length    Exercises Total Joint Exercises Quad Sets: AROM;Right;10 reps;Seated Heel Slides: AAROM;Right Hip ABduction/ADduction: AROM;10 reps;Seated;Right Straight Leg Raises: AAROM;Right;10 reps   PT Diagnosis:    PT Problem List:   PT Treatment Interventions:     PT Goals  (current goals can now be found in the care plan section)    Visit Information  Last PT Received On: 08/28/12 Assistance Needed: +1 History of Present Illness: s/p elective Rt TKA     Subjective Data      Cognition  Cognition Arousal/Alertness: Awake/alert Behavior During Therapy: WFL for tasks assessed/performed Overall Cognitive Status: Within Functional Limits for tasks assessed    Balance     End of Session PT - End of Session Equipment Utilized During Treatment: Gait belt Activity Tolerance: Patient tolerated treatment well Patient left: in chair;with call bell/phone within reach   GP     Fredrich Birks 08/28/2012, 10:14 AM 08/28/2012 Fredrich Birks PTA [phone] pager [phone] office

## 2012-08-28 NOTE — Clinical Social Work Placement (Addendum)
Clinical Social Work Department  CLINICAL SOCIAL WORK PLACEMENT NOTE  08/28/2012 Patient: LIDYA MCCALISTER Account Number: [id number]  Admit date: 08/26/12 Clinical Social Worker: Sabino Niemann MSW Date/time: 08/27/12 11:30 AM  Clinical Social Work is seeking post-discharge placement for this patient at the following level of care: SKILLED NURSING (*CSW will update this form in Epic as items are completed)  8/14/14Patient/family provided with [facility] Department of Clinical Social Work's list of facilities offering this level of care within the geographic area requested by the patient (or if unable, by the patient's family).  8/14/14Patient/family informed of their freedom to choose among providers that offer the needed level of care, that participate in Medicare, Medicaid or managed care program needed by the patient, have an available bed and are willing to accept the patient.  8/14/14Patient/family informed of [facility]' ownership interest in [facility], as well as of the fact that they are under no obligation to receive care at this facility.  PASARR submitted to EDS on 08/27/12  PASARR number received from EDS on 08/28/2012 FL2 transmitted to all facilities in geographic area requested by pt/family on 08/27/12 FL2 transmitted to all facilities within larger geographic area on  Patient informed that his/her managed care company has contracts with or will negotiate with certain facilities, including the following:  Patient/family informed of bed offers received: 08/27/12 Patient chooses bed at [facility] Physician recommends and patient chooses bed at  Patient to be transferred to [facility] on 08/29/12 Patient to be transferred to facility by son's personal vehicle.  The following physician request were entered in Epic:  Additional Comments:

## 2012-08-28 NOTE — Progress Notes (Signed)
Clinical Social Work Department  BRIEF PSYCHOSOCIAL ASSESSMENT  Patient: [name] [name]  Account Number: [id number]  Admit date: 08/26/12 Clinical Social Worker Sabino Niemann, MSW Date/Time: 08/27/12 2:30 PM Referred by: Physician Date Referred:  Referred for   SNF Placement   Other Referral:  Interview type: Patient and patient's daughters Other interview type: PSYCHOSOCIAL DATA  Living Status:Alone Admitted from facility:  Level of care:  Primary support name: Rito Ehrlich Primary support relationship to patient: Daughter Degree of support available:  Strong and vested  CURRENT CONCERNS  Current Concerns   Post-Acute Placement   Other Concerns:  SOCIAL WORK ASSESSMENT / PLAN  CSW met with pt re: PT recommendation for SNF.   Pt lives alone   CSW explained placement process and answered questions.   Pt reports [facility]  as her preference    CSW completed FL2 and initiated SNF search.     Assessment/plan status: Information/Referral to [facility]  Other assessment/ plan:  Information/referral to community resources:  SNF     PATIENT'S/FAMILY'S RESPONSE TO PLAN OF CARE:  Pt  reports she is agreeable to ST SNF in order to increase strength and independence with mobility prior to returning home  Pt verbalized understanding of placement process and appreciation for CSW assist.   Sabino Niemann, MSW [phone]

## 2012-08-28 NOTE — Progress Notes (Signed)
OT DISCHARGE  Order received, chart reviewed, pt is Medicare, lives alone, and plan is ST SNF. OT will defer eval to SNF facility. Will sign off acutely.  Ignacia Palma, [place] [phone] 08/28/2012

## 2012-08-28 NOTE — Discharge Summary (Signed)
Patient ID: [name] [name] MRN: [id number] DOB/AGE: 15-Oct-1934 78 y.o.  Admit date: 08/26/2012 Discharge date: 08/29/12   Admission Diagnoses: DJD RIGHT KNEE Past Medical History  Diagnosis Date  . Diabetes mellitus   . Hypertension   . Aortic stenosis     s/p AVR August 2012  . Hypercholesterolemia   . OA (osteoarthritis)   . CAD (coronary artery disease)     s/p CABG x 15 August 2010  . Hyperlipidemia   . Degenerative arthritis of knee   . Obesity     Discharge Diagnoses:  Principal Problem:   Degenerative arthritis of knee Active Problems:   Hypertension   Aortic stenosis   Hypercholesterolemia   Diabetes mellitus type II, non insulin dependent   CAD (coronary artery disease)   OA (osteoarthritis)   Hyperlipidemia   S/P AVR (aortic valve replacement)   Postoperative anemia due to acute blood loss   Surgeries: Procedure(s): TOTAL KNEE ARTHROPLASTY on 08/26/2012    Consultants:    Discharged Condition: Improved  Hospital Course: [name] [name] is an 78 y.o. female who was admitted 08/26/2012 with a chief complaint of No chief complaint on file. , and found to have a diagnosis of DJD RIGHT KNEE.  They were brought to the operating room on 08/26/2012 and underwent Procedure(s): TOTAL KNEE ARTHROPLASTY.    They were given perioperative antibiotics: Anti-infectives   Start     Dose/Rate Route Frequency Ordered Stop   08/27/12 1800  ceFAZolin (ANCEF) IVPB 2 g/50 mL premix  Status:  Discontinued     2 g 100 mL/hr over 30 Minutes Intravenous Every 6 hours 08/26/12 1722 08/26/12 1740   08/26/12 1745  ceFAZolin (ANCEF) IVPB 2 g/50 mL premix     2 g 100 mL/hr over 30 Minutes Intravenous Every 6 hours 08/26/12 1740 08/26/12 2321   08/26/12 1300  ceFAZolin (ANCEF) IVPB 2 g/50 mL premix  Status:  Discontinued     2 g 100 mL/hr over 30 Minutes Intravenous Every 6 hours 08/26/12 1152 08/26/12 1722   08/26/12 0600  ceFAZolin (ANCEF) IVPB 2 g/50 mL premix     2 g 100  mL/hr over 30 Minutes Intravenous On call to O.R. 08/25/12 1431 08/26/12 0846    .  They were given sequential compression devices, early ambulation, and Other (comment)ASA for DVT prophylaxis.  Recent vital signs: Patient Vitals for the past 24 hrs:  BP Temp Temp src Pulse Resp SpO2  08/28/12 1300 157/51 mmHg 98.2 F (36.8 C) - 80 18 98 %  08/28/12 1140 - - - - 18 -  08/28/12 0950 143/52 mmHg - - 86 - -  08/28/12 0645 119/50 mmHg 100.5 F (38.1 C) Oral 88 18 95 %  08/28/12 0020 162/53 mmHg 99.8 F (37.7 C) Oral 96 20 96 %  08/27/12 2345 145/56 mmHg 99.1 F (37.3 C) Oral 91 20 98 %  08/27/12 2245 144/47 mmHg 99.2 F (37.3 C) Oral 91 20 99 %  08/27/12 2145 148/53 mmHg 98.8 F (37.1 C) Oral 88 20 96 %  08/27/12 2105 144/50 mmHg 99.8 F (37.7 C) Oral 89 20 98 %  08/27/12 2050 157/52 mmHg 99.8 F (37.7 C) Oral 87 20 96 %  08/27/12 2012 144/70 mmHg 99.5 F (37.5 C) Oral 88 18 99 %  08/27/12 1915 142/52 mmHg 98.8 F (37.1 C) - 78 18 -  08/27/12 1845 138/54 mmHg 98.6 F (37 C) - 74 18 -  08/27/12 1745 127/42 mmHg 98.7 F (37.1 C) -  75 18 -  08/27/12 1645 125/44 mmHg 98.7 F (37.1 C) - 74 18 -  08/27/12 1630 104/60 mmHg 98.4 F (36.9 C) - 66 18 -  08/27/12 1526 - - - - 18 -  08/27/12 1430 120/63 mmHg 97.6 F (36.4 C) - 68 17 99 %  .  Recent laboratory studies: No results found.  Discharge Medications:     Medication List         acetaminophen 500 MG tablet  Commonly known as:  TYLENOL  Take 500 mg by mouth every 6 (six) hours as needed.     aspirin 325 MG EC tablet  Take 1 tablet (325 mg total) by mouth daily with breakfast.     bisacodyl 5 MG EC tablet  Commonly known as:  DULCOLAX  Take 2 tablets (10 mg total) by mouth every morning.     clopidogrel 75 MG tablet  Commonly known as:  PLAVIX  Take 75 mg by mouth daily.     DSS 100 MG Caps  Take 100 mg by mouth 2 (two) times daily.     ezetimibe 10 MG tablet  Commonly known as:  ZETIA  Take 1 tablet (10 mg  total) by mouth daily.     fenofibrate micronized 134 MG capsule  Commonly known as:  LOFIBRA  Take 134 mg by mouth daily before breakfast.     metFORMIN 1000 MG tablet  Commonly known as:  GLUCOPHAGE  Take 1 tablet (1,000 mg total) by mouth 2 (two) times daily with a meal.     metoprolol tartrate 25 MG tablet  Commonly known as:  LOPRESSOR  Take 25 mg by mouth 2 (two) times daily.     oxyCODONE 5 MG immediate release tablet  Commonly known as:  Oxy IR/ROXICODONE  1-2 tablets every 4-6 hrs as needed for pain     valsartan-hydrochlorothiazide 320-25 MG per tablet  Commonly known as:  DIOVAN-HCT  Take 1 tablet by mouth daily. PATIENT NEEDS OFFICE VISIT FOR ADDITIONAL REFILLS - 2nd NOTICE!        Diagnostic Studies: Dg Chest 2 View  08/26/2012   *RADIOLOGY REPORT*  Clinical Data: Preoperative total knee replacement  CHEST - 2 VIEW  Comparison: September 11, 2010  Findings: Lungs clear.  Heart size and pulmonary vascularity are normal.  The patient is status post aortic valve replacement.  No adenopathy.  There is mild degenerative change in the thoracic spine.  IMPRESSION: No edema or consolidation.   Original Report Authenticated By: Bretta Bang, M.D.   Dg Knee Right Port  08/26/2012   *RADIOLOGY REPORT*  Clinical Data: Postop right knee arthroplasty  PORTABLE RIGHT KNEE - 1-2 VIEW  Comparison: None.  Findings: Components appear well positioned.  Suprapatellar drain in place.  No complication or unexpected finding.  IMPRESSION: Good appearance following total knee replacement.   Original Report Authenticated By: Paulina Fusi, M.D.    They benefited maximally from their hospital stay and there were no complications.     Disposition: 06-Home-Health Care Svc     Discharge Orders   Future Orders Complete By Expires   Call MD / Call 911  As directed    Comments:     If you experience chest pain or shortness of breath, CALL 911 and be transported to the hospital emergency room.  If  you develope a fever above 101 F, pus (white drainage) or increased drainage or redness at the wound, or calf pain, call your surgeon's office.  Call MD / Call 911  As directed    Comments:     If you experience chest pain or shortness of breath, CALL 911 and be transported to the hospital emergency room.  If you develope a fever above 101 F, pus (white drainage) or increased drainage or redness at the wound, or calf pain, call your surgeon's office.   Change dressing  As directed    Comments:     Change the dressing daily with sterile 4 x 4 inch gauze dressing and apply TED hose.  You may clean the incision with alcohol prior to redressing.   Change dressing  As directed    Comments:     Change the dressing daily with sterile 4 x 4 inch gauze dressing and apply TED hose.  You may clean the incision with alcohol prior to redressing.   Constipation Prevention  As directed    Comments:     Drink plenty of fluids.  Prune juice may be helpful.  You may use a stool softener, such as Colace (over the counter) 100 mg twice a day.  Use MiraLax (over the counter) for constipation as needed.   Constipation Prevention  As directed    Comments:     Drink plenty of fluids.  Prune juice may be helpful.  You may use a stool softener, such as Colace (over the counter) 100 mg twice a day.  Use MiraLax (over the counter) for constipation as needed.   CPM  As directed    Comments:     Continuous passive motion machine (CPM):      Use the CPM from 0 to 90 for 6 hours per day.       You may break it up into 2 or 3 sessions per day.      Use CPM for 2 weeks or until you are told to stop.   CPM  As directed    Comments:     Continuous passive motion machine (CPM):      Use the CPM from 0 to 90 for 6 hours per day.       You may break it up into 2 or 3 sessions per day.      Use CPM for 2 weeks or until you are told to stop.   Diet - low sodium heart healthy  As directed    Diet - low sodium heart healthy  As  directed    Discharge instructions  As directed    Comments:     Total Knee Replacement Care After Refer to this sheet in the next few weeks. These discharge instructions provide you with general information on caring for yourself after you leave the hospital. Your caregiver may also give you specific instructions. Your treatment has been planned according to the most current medical practices available, but unavoidable complications sometimes occur. If you have any problems or questions after discharge, please call your caregiver. Regaining a near full range of motion of your knee within the first 3 to 6 weeks after surgery is critical. HOME CARE INSTRUCTIONS  You may resume a normal diet and activities as directed.  Perform exercises as directed.  Place gray foam block, curve side up under heel at all times except when in CPM or when walking.  DO NOT modify, tear, cut, or change in any way the gray foam block. You will receive physical therapy daily  Take showers instead of baths until informed otherwise.  You may shower on Sunday.  Please wash whole leg including wound with soap and water  Change bandages (dressings)daily It is OK to take over-the-counter tylenol in addition to the oxycodone for pain, discomfort, or fever. Oxycodone is VERY constipating.  Please take stool softener twice a day and laxatives daily until bowels are regular Eat a well-balanced diet.  Avoid lifting or driving until you are instructed otherwise.  Make an appointment to see your caregiver for stitches (suture) or staple removal as directed.  If you have been sent home with a continuous passive motion machine (CPM machine), 0-90 degrees 6 hrs a day   2 hrs a shift SEEK MEDICAL CARE IF: You have swelling of your calf or leg.  You develop shortness of breath or chest pain.  You have redness, swelling, or increasing pain in the wound.  There is pus or any unusual drainage coming from the surgical site.  You notice a  bad smell coming from the surgical site or dressing.  The surgical site breaks open after sutures or staples have been removed.  There is persistent bleeding from the suture or staple line.  You are getting worse or are not improving.  You have any other questions or concerns.  SEEK IMMEDIATE MEDICAL CARE IF:  You have a fever.  You develop a rash.  You have difficulty breathing.  You develop any reaction or side effects to medicines given.  Your knee motion is decreasing rather than improving.  MAKE SURE YOU:  Understand these instructions.  Will watch your condition.  Will get help right away if you are not doing well or get worse.   Discharge instructions  As directed    Comments:     Total Knee Replacement Care After Refer to this sheet in the next few weeks. These discharge instructions provide you with general information on caring for yourself after you leave the hospital. Your caregiver may also give you specific instructions. Your treatment has been planned according to the most current medical practices available, but unavoidable complications sometimes occur. If you have any problems or questions after discharge, please call your caregiver. Regaining a near full range of motion of your knee within the first 3 to 6 weeks after surgery is critical. HOME CARE INSTRUCTIONS  You may resume a normal diet and activities as directed.  Perform exercises as directed.  Place gray foam block, curve side up under heel at all times except when in CPM or when walking.  DO NOT modify, tear, cut, or change in any way the gray foam block. You will receive physical therapy daily  Take showers instead of baths until informed otherwise.  You may shower on Sunday.  Please wash whole leg including wound with soap and water  Change bandages (dressings)daily It is OK to take over-the-counter tylenol in addition to the oxycodone for pain, discomfort, or fever. Oxycodone is VERY constipating.  Please  take stool softener twice a day and laxatives daily until bowels are regular Eat a well-balanced diet.  Avoid lifting or driving until you are instructed otherwise.  Make an appointment to see your caregiver for stitches (suture) or staple removal as directed.  If you have been sent home with a continuous passive motion machine (CPM machine), 0-90 degrees 6 hrs a day   2 hrs a shift SEEK MEDICAL CARE IF: You have swelling of your calf or leg.  You develop shortness of breath or chest pain.  You have redness, swelling, or increasing pain in the wound.  There  is pus or any unusual drainage coming from the surgical site.  You notice a bad smell coming from the surgical site or dressing.  The surgical site breaks open after sutures or staples have been removed.  There is persistent bleeding from the suture or staple line.  You are getting worse or are not improving.  You have any other questions or concerns.  SEEK IMMEDIATE MEDICAL CARE IF:  You have a fever.  You develop a rash.  You have difficulty breathing.  You develop any reaction or side effects to medicines given.  Your knee motion is decreasing rather than improving.  MAKE SURE YOU:  Understand these instructions.  Will watch your condition.  Will get help right away if you are not doing well or get worse.   Do not put a pillow under the knee. Place it under the heel.  As directed    Do not put a pillow under the knee. Place it under the heel.  As directed    Comments:     Place gray foam block, curve side up under heel at all times except when in CPM or when walking.  DO NOT modify, tear, cut, or change in any way the gray foam block.   Increase activity slowly as tolerated  As directed    Increase activity slowly as tolerated  As directed    TED hose  As directed    Comments:     Use stockings (TED hose) for 2 weeks on both leg(s).  You may remove them at night for sleeping.   TED hose  As directed    Comments:     Use  stockings (TED hose) for 2 weeks on both leg(s).  You may remove them at night for sleeping.        SignedClarene Critchley 08/28/2012, 2:27 PM

## 2012-08-29 DIAGNOSIS — E785 Hyperlipidemia, unspecified: Secondary | ICD-10-CM | POA: Diagnosis not present

## 2012-08-29 DIAGNOSIS — I10 Essential (primary) hypertension: Secondary | ICD-10-CM | POA: Diagnosis not present

## 2012-08-29 DIAGNOSIS — I2510 Atherosclerotic heart disease of native coronary artery without angina pectoris: Secondary | ICD-10-CM | POA: Diagnosis not present

## 2012-08-29 DIAGNOSIS — E669 Obesity, unspecified: Secondary | ICD-10-CM | POA: Diagnosis not present

## 2012-08-29 DIAGNOSIS — M1710 Unilateral primary osteoarthritis, unspecified knee: Secondary | ICD-10-CM | POA: Diagnosis not present

## 2012-08-29 DIAGNOSIS — R072 Precordial pain: Secondary | ICD-10-CM | POA: Diagnosis not present

## 2012-08-29 DIAGNOSIS — R279 Unspecified lack of coordination: Secondary | ICD-10-CM | POA: Diagnosis not present

## 2012-08-29 DIAGNOSIS — E1059 Type 1 diabetes mellitus with other circulatory complications: Secondary | ICD-10-CM | POA: Diagnosis not present

## 2012-08-29 DIAGNOSIS — D649 Anemia, unspecified: Secondary | ICD-10-CM | POA: Diagnosis not present

## 2012-08-29 DIAGNOSIS — Z4789 Encounter for other orthopedic aftercare: Secondary | ICD-10-CM | POA: Diagnosis not present

## 2012-08-29 DIAGNOSIS — Z79899 Other long term (current) drug therapy: Secondary | ICD-10-CM | POA: Diagnosis not present

## 2012-08-29 DIAGNOSIS — Z96659 Presence of unspecified artificial knee joint: Secondary | ICD-10-CM | POA: Diagnosis not present

## 2012-08-29 DIAGNOSIS — R3989 Other symptoms and signs involving the genitourinary system: Secondary | ICD-10-CM | POA: Diagnosis not present

## 2012-08-29 DIAGNOSIS — M6281 Muscle weakness (generalized): Secondary | ICD-10-CM | POA: Diagnosis not present

## 2012-08-29 DIAGNOSIS — Z7902 Long term (current) use of antithrombotics/antiplatelets: Secondary | ICD-10-CM | POA: Diagnosis not present

## 2012-08-29 DIAGNOSIS — Z471 Aftercare following joint replacement surgery: Secondary | ICD-10-CM | POA: Diagnosis not present

## 2012-08-29 DIAGNOSIS — E119 Type 2 diabetes mellitus without complications: Secondary | ICD-10-CM | POA: Diagnosis not present

## 2012-08-29 DIAGNOSIS — R269 Unspecified abnormalities of gait and mobility: Secondary | ICD-10-CM | POA: Diagnosis not present

## 2012-08-29 DIAGNOSIS — E782 Mixed hyperlipidemia: Secondary | ICD-10-CM | POA: Diagnosis not present

## 2012-08-29 DIAGNOSIS — R079 Chest pain, unspecified: Secondary | ICD-10-CM | POA: Diagnosis not present

## 2012-08-29 DIAGNOSIS — Z952 Presence of prosthetic heart valve: Secondary | ICD-10-CM | POA: Diagnosis not present

## 2012-08-29 DIAGNOSIS — Z951 Presence of aortocoronary bypass graft: Secondary | ICD-10-CM | POA: Diagnosis not present

## 2012-08-29 DIAGNOSIS — M1990 Unspecified osteoarthritis, unspecified site: Secondary | ICD-10-CM | POA: Diagnosis not present

## 2012-08-29 LAB — BASIC METABOLIC PANEL
Chloride: 102 mEq/L (ref 96–112)
Creatinine, Ser: 0.77 mg/dL (ref 0.50–1.10)
GFR calc Af Amer: >90 mL/min (ref >90)

## 2012-08-29 LAB — CBC
MCV: 79.2 fL (ref 78.0–100.0)
Platelets: 211 10*3/uL (ref 150–400)
RDW: 15.8 % — ABNORMAL HIGH (ref 11.5–15.5)
WBC: 8.5 10*3/uL (ref 4.0–10.5)

## 2012-08-29 LAB — GLUCOSE, CAPILLARY: Glucose-Capillary: 141 mg/dL — ABNORMAL HIGH (ref 70–99)

## 2012-08-29 NOTE — Progress Notes (Signed)
Patient is medically ready for D/C today to [facility]. Patient verbalized understanding and agreement to plan. Patient will be transported to [facility] via son's personal vehical. CSW completed D/C packet and put it on shadow chart. Nursing is aware of above. CSW will sign off.  Jetta Lout, LCSWA Weekend CSW [phone]

## 2012-08-29 NOTE — Progress Notes (Signed)
Physical Therapy Treatment Patient Details Name: [name] [name] MRN: [id number] DOB: [birth year] Today's Date: 08/29/2012 Time: 4098-1191 PT Time Calculation (min): 24 min  PT Assessment / Plan / Recommendation  History of Present Illness s/p elective Rt TKA    PT Comments   Pt cont's to progress with mobility.    Follow Up Recommendations  SNF;Supervision/Assistance - 24 hour     Does the patient have the potential to tolerate intense rehabilitation     Barriers to Discharge        Equipment Recommendations       Recommendations for Other Services    Frequency 7X/week   Progress towards PT Goals Progress towards PT goals: Progressing toward goals  Plan Current plan remains appropriate    Precautions / Restrictions Precautions Precautions: Fall;Knee Required Braces or Orthoses: Knee Immobilizer - Right Restrictions RLE Weight Bearing: Weight bearing as tolerated   Pertinent Vitals/Pain 5/10 Rt knee.     Mobility  Bed Mobility Bed Mobility: Not assessed Transfers Transfers: Sit to Stand;Stand to Sit Sit to Stand: 4: Min guard;With upper extremity assist;With armrests;From chair/3-in-1 Stand to Sit: 4: Min guard;With upper extremity assist;With armrests;To chair/3-in-1 Details for Transfer Assistance: cues for hand placement.  Incr time to stand Ambulation/Gait Ambulation/Gait Assistance: 5: Supervision Ambulation Distance (Feet): 120 Feet Assistive device: Rolling walker Ambulation/Gait Assistance Details: cues to look fowards due to pt keeps eyes directed towards floor,  cues for step-through pattern., & encouragement to decrease reliance of UE's on RW  Gait Pattern: Step-to pattern;Step-through pattern;Decreased step length - right;Decreased step length - left;Decreased weight shift to right Gait velocity: decreased Stairs: No Wheelchair Mobility Wheelchair Mobility: No    Exercises Total Joint Exercises Ankle Circles/Pumps: AROM;Both;15 reps Quad Sets:  AROM;Both;10 reps Hip ABduction/ADduction: AAROM;Strengthening;Right;10 reps Straight Leg Raises: AAROM;Strengthening;Right;10 reps Long Arc Quad: AAROM;Strengthening;Right;10 reps Knee Flexion: AAROM;Right;10 reps     PT Goals (current goals can now be found in the care plan section) Acute Rehab PT Goals PT Goal Formulation: With patient Time For Goal Achievement: 09/02/12 Potential to Achieve Goals: Good  Visit Information  Last PT Received On: 08/29/12 Assistance Needed: +1 History of Present Illness: s/p elective Rt TKA     Subjective Data      Cognition  Cognition Arousal/Alertness: Awake/alert Behavior During Therapy: WFL for tasks assessed/performed Overall Cognitive Status: Within Functional Limits for tasks assessed    Balance  Balance Balance Assessed: No  End of Session PT - End of Session Equipment Utilized During Treatment: Right knee immobilizer Activity Tolerance: Patient tolerated treatment well Patient left: in chair;with call bell/phone within reach Nurse Communication: Mobility status   GP     Lara Mulch 08/29/2012, 9:30 AM  Verdell Face, PTA [phone] 08/29/2012

## 2012-08-31 ENCOUNTER — Encounter: Payer: Self-pay | Admitting: Adult Health

## 2012-09-02 ENCOUNTER — Non-Acute Institutional Stay (SKILLED_NURSING_FACILITY): Payer: Medicare Other | Admitting: Internal Medicine

## 2012-09-02 DIAGNOSIS — I2510 Atherosclerotic heart disease of native coronary artery without angina pectoris: Secondary | ICD-10-CM | POA: Diagnosis not present

## 2012-09-02 DIAGNOSIS — E119 Type 2 diabetes mellitus without complications: Secondary | ICD-10-CM | POA: Diagnosis not present

## 2012-09-02 DIAGNOSIS — I10 Essential (primary) hypertension: Secondary | ICD-10-CM | POA: Diagnosis not present

## 2012-09-02 DIAGNOSIS — M1710 Unilateral primary osteoarthritis, unspecified knee: Secondary | ICD-10-CM | POA: Diagnosis not present

## 2012-09-06 ENCOUNTER — Encounter (HOSPITAL_COMMUNITY): Payer: Self-pay | Admitting: Emergency Medicine

## 2012-09-06 ENCOUNTER — Observation Stay (HOSPITAL_COMMUNITY)
Admission: EM | Admit: 2012-09-06 | Discharge: 2012-09-07 | Disposition: A | Discharge status: Skilled Nursing Facility | Payer: No Typology Code available for payment source | Attending: Cardiology | Admitting: Cardiology

## 2012-09-06 ENCOUNTER — Emergency Department (HOSPITAL_COMMUNITY): Payer: No Typology Code available for payment source

## 2012-09-06 DIAGNOSIS — R079 Chest pain, unspecified: Secondary | ICD-10-CM

## 2012-09-06 DIAGNOSIS — E785 Hyperlipidemia, unspecified: Secondary | ICD-10-CM | POA: Diagnosis present

## 2012-09-06 DIAGNOSIS — Z7902 Long term (current) use of antithrombotics/antiplatelets: Secondary | ICD-10-CM | POA: Insufficient documentation

## 2012-09-06 DIAGNOSIS — Z952 Presence of prosthetic heart valve: Secondary | ICD-10-CM

## 2012-09-06 DIAGNOSIS — I10 Essential (primary) hypertension: Secondary | ICD-10-CM | POA: Diagnosis not present

## 2012-09-06 DIAGNOSIS — E119 Type 2 diabetes mellitus without complications: Secondary | ICD-10-CM | POA: Diagnosis not present

## 2012-09-06 DIAGNOSIS — R0789 Other chest pain: Secondary | ICD-10-CM

## 2012-09-06 DIAGNOSIS — Z951 Presence of aortocoronary bypass graft: Secondary | ICD-10-CM | POA: Diagnosis not present

## 2012-09-06 DIAGNOSIS — R3989 Other symptoms and signs involving the genitourinary system: Secondary | ICD-10-CM | POA: Insufficient documentation

## 2012-09-06 DIAGNOSIS — I2510 Atherosclerotic heart disease of native coronary artery without angina pectoris: Secondary | ICD-10-CM | POA: Diagnosis not present

## 2012-09-06 DIAGNOSIS — R072 Precordial pain: Secondary | ICD-10-CM | POA: Diagnosis not present

## 2012-09-06 DIAGNOSIS — Z79899 Other long term (current) drug therapy: Secondary | ICD-10-CM | POA: Insufficient documentation

## 2012-09-06 LAB — CREATININE, SERUM
Creatinine, Ser: 1.03 mg/dL (ref 0.50–1.10)
GFR calc non Af Amer: 52 mL/min — ABNORMAL LOW (ref >90)

## 2012-09-06 LAB — BASIC METABOLIC PANEL
CO2: 28 mEq/L (ref 19–32)
Chloride: 101 mEq/L (ref 96–112)
Glucose, Bld: 142 mg/dL — ABNORMAL HIGH (ref 70–99)
Potassium: 4.5 mEq/L (ref 3.5–5.1)
Sodium: 137 mEq/L (ref 135–145)

## 2012-09-06 LAB — GLUCOSE, CAPILLARY: Glucose-Capillary: 107 mg/dL — ABNORMAL HIGH (ref 70–99)

## 2012-09-06 LAB — CBC WITH DIFFERENTIAL/PLATELET
Lymphocytes Relative: 24 % (ref 12–46)
Lymphs Abs: 1.3 10*3/uL (ref 0.7–4.0)
MCV: 80.6 fL (ref 78.0–100.0)
Neutro Abs: 3.6 10*3/uL (ref 1.7–7.7)
Neutrophils Relative %: 65 % (ref 43–77)
Platelets: 437 10*3/uL — ABNORMAL HIGH (ref 150–400)
RBC: 3.51 MIL/uL — ABNORMAL LOW (ref 3.87–5.11)
WBC: 5.5 10*3/uL (ref 4.0–10.5)

## 2012-09-06 LAB — POCT I-STAT TROPONIN I

## 2012-09-06 LAB — CBC
Hemoglobin: 9.5 g/dL — ABNORMAL LOW (ref 12.0–15.0)
RBC: 3.58 MIL/uL — ABNORMAL LOW (ref 3.87–5.11)

## 2012-09-06 MED ORDER — CLOPIDOGREL BISULFATE 75 MG PO TABS
75.0000 mg | ORAL_TABLET | Freq: Every day | ORAL | Status: DC
  Administered 2012-09-07: 75 mg via ORAL
  Filled 2012-09-06: qty 1

## 2012-09-06 MED ORDER — METOPROLOL TARTRATE 25 MG PO TABS
25.0000 mg | ORAL_TABLET | Freq: Two times a day (BID) | ORAL | Status: DC
  Administered 2012-09-06 – 2012-09-07 (×2): 25 mg via ORAL
  Filled 2012-09-06 (×3): qty 1

## 2012-09-06 MED ORDER — ACETAMINOPHEN 325 MG PO TABS: 650.0000 mg | ORAL_TABLET | ORAL | Status: DC | PRN

## 2012-09-06 MED ORDER — MORPHINE SULFATE 4 MG/ML IJ SOLN: 4.0000 mg | Freq: Once | INTRAMUSCULAR | Status: DC

## 2012-09-06 MED ORDER — BISACODYL 5 MG PO TBEC
10.0000 mg | DELAYED_RELEASE_TABLET | Freq: Every morning | ORAL | Status: DC
  Filled 2012-09-06: qty 2

## 2012-09-06 MED ORDER — EZETIMIBE 10 MG PO TABS
10.0000 mg | ORAL_TABLET | Freq: Every day | ORAL | Status: DC
  Administered 2012-09-07: 10 mg via ORAL
  Filled 2012-09-06 (×2): qty 1

## 2012-09-06 MED ORDER — ASPIRIN EC 325 MG PO TBEC
325.0000 mg | DELAYED_RELEASE_TABLET | Freq: Every day | ORAL | Status: DC
  Administered 2012-09-07: 325 mg via ORAL
  Filled 2012-09-06: qty 1

## 2012-09-06 MED ORDER — SODIUM CHLORIDE 0.9 % IV SOLN
INTRAVENOUS | Status: DC
  Administered 2012-09-06: 21:00:00 via INTRAVENOUS

## 2012-09-06 MED ORDER — ONDANSETRON HCL 4 MG/2ML IJ SOLN: 4.0000 mg | Freq: Four times a day (QID) | INTRAMUSCULAR | Status: DC | PRN

## 2012-09-06 MED ORDER — NITROGLYCERIN 0.4 MG SL SUBL: 0.4000 mg | SUBLINGUAL_TABLET | SUBLINGUAL | Status: DC | PRN

## 2012-09-06 MED ORDER — ENOXAPARIN SODIUM 40 MG/0.4ML ~~LOC~~ SOLN
40.0000 mg | SUBCUTANEOUS | Status: DC
  Administered 2012-09-06: 40 mg via SUBCUTANEOUS
  Filled 2012-09-06 (×2): qty 0.4

## 2012-09-06 MED ORDER — ASPIRIN EC 81 MG PO TBEC: 81.0000 mg | DELAYED_RELEASE_TABLET | Freq: Every day | ORAL | Status: DC

## 2012-09-06 MED ORDER — ONDANSETRON HCL 4 MG/2ML IJ SOLN: 4.0000 mg | Freq: Once | INTRAMUSCULAR | Status: DC

## 2012-09-06 MED ORDER — DOCUSATE SODIUM 100 MG PO CAPS
100.0000 mg | ORAL_CAPSULE | Freq: Two times a day (BID) | ORAL | Status: DC
  Administered 2012-09-06 – 2012-09-07 (×2): 100 mg via ORAL
  Filled 2012-09-06 (×3): qty 1

## 2012-09-06 NOTE — ED Notes (Addendum)
Received pt from [facility] with c/o non radiating center CP onset x 1 hour ago. Pt reports that she had an episode of near syncope yesterday. Upon arrival of EMS pt pain 4/10, during ride pt pain decreased to 0/10. Pt placed on [place] O2 @ 2L by EMS and 20g IV in left hand. According to EMS, MAR from Nursing home showed pt was given 81 mg of ASA, pt reports, "They gave me something but I don't know what it was." Pt remains pain free at present.

## 2012-09-06 NOTE — H&P (Addendum)
CARDIOLOGY CONSULT NOTE  Patient ID: [name] [name], MRN: [id number], DOB/AGE: [id number] y.o. Admit date: 09/06/2012 Date of Consult: 09/06/2012  Primary Physician: Shade Flood, MD Primary Cardiologist: Peter Swaziland, MD  Chief Complaint: chest pain  HPI: 78 yo female, s/p 1v CABG with LIMA-LAD, 08/2010, and bioprosthetic AVR with a 21 mm Edwards pericardial Magna-Ease valve, who underwent right TKR on August 13, by Dr. Mckinley Jewel, for end-stage degenerative arthritis. She was discharged on POD #3 and transferred to local nursing center for rehabilitation.   Past Medical History  Diagnosis Date  . Diabetes mellitus   . Hypertension   . Aortic stenosis     s/p AVR August 2012  . Hypercholesterolemia   . OA (osteoarthritis)   . CAD (coronary artery disease)     s/p CABG x 15 August 2010  . Hyperlipidemia   . Degenerative arthritis of knee   . Obesity       Surgical History:  Past Surgical History  Procedure Laterality Date  . Cardiac catheterization  07/25/2010    Dr Swaziland  . Coronary artery bypass graft  08/17/10    x1, Dr Laneta Simmers with LIMA to LAD  . Aortic valve replacement  08/17/2010    using a 21-mm Edwards pericardial Magna - Ease valve.   . Back surgery    . Total knee arthroplasty Right 08/26/2012    Dr Eulah Pont  . Total knee arthroplasty Right 08/26/2012    Procedure: TOTAL KNEE ARTHROPLASTY;  Surgeon: Loreta Ave, MD;  Location: [facility] OR;  Service: Orthopedics;  Laterality: Right;     Home Meds: Prior to Admission medications   Medication Sig Start Date End Date Taking? Authorizing Provider  acetaminophen (TYLENOL) 500 MG tablet Take 500 mg by mouth every 6 (six) hours as needed.     Yes Historical Provider, MD  aspirin EC 325 MG EC tablet Take 1 tablet (325 mg total) by mouth daily with breakfast. 08/28/12  Yes Kirstin J Shepperson, PA-C  bisacodyl (DULCOLAX) 5 MG EC tablet Take 2 tablets (10 mg total) by mouth every morning. 08/28/12  Yes Kirstin J  Shepperson, PA-C  clopidogrel (PLAVIX) 75 MG tablet Take 75 mg by mouth daily.   Yes Historical Provider, MD  docusate sodium 100 MG CAPS Take 100 mg by mouth 2 (two) times daily. 08/28/12  Yes Kirstin J Shepperson, PA-C  hydrochlorothiazide (HYDRODIURIL) 25 MG tablet Take 25 mg by mouth daily.   Yes Historical Provider, MD  lisinopril (PRINIVIL,ZESTRIL) 40 MG tablet Take 40 mg by mouth daily.   Yes Historical Provider, MD  metFORMIN (GLUCOPHAGE) 1000 MG tablet Take 1 tablet (1,000 mg total) by mouth 2 (two) times daily with a meal. 08/28/12 10/16/13 Yes Kirstin J Shepperson, PA-C  metoprolol tartrate (LOPRESSOR) 25 MG tablet Take 25 mg by mouth 2 (two) times daily.   Yes Historical Provider, MD  oxyCODONE (OXY IR/ROXICODONE) 5 MG immediate release tablet 1-2 tablets every 4-6 hrs as needed for pain 08/28/12  Yes Kirstin J Shepperson, PA-C  ezetimibe (ZETIA) 10 MG tablet Take 1 tablet (10 mg total) by mouth daily. 07/10/12 07/10/13  Peter M Swaziland, MD  fenofibrate micronized (LOFIBRA) 134 MG capsule Take 134 mg by mouth daily before breakfast.    Historical Provider, MD  valsartan-hydrochlorothiazide (DIOVAN-HCT) 320-25 MG per tablet Take 1 tablet by mouth daily. PATIENT NEEDS OFFICE VISIT FOR ADDITIONAL REFILLS - 2nd NOTICE! 07/06/12   Godfrey Pick, PA-C    Inpatient Medications:  .  morphine  injection  4 mg Intravenous Once  . ondansetron  4 mg Intravenous Once    Allergies:  Allergies  Allergen Reactions  . Aspirin     On MAR  . Crestor [Rosuvastatin Calcium]     On MAR  . Lipitor [Atorvastatin Calcium]     On MAR  . Norvasc [Amlodipine Besylate]     On MAR    History   Social History  . Marital Status: Single    Spouse Name: N/A    Number of Children: 3  . Years of Education: N/A   Occupational History  . Not on file.   Social History Main Topics  . Smoking status: Never Smoker   . Smokeless tobacco: Never Used  . Alcohol Use: Yes     Comment: rarely  . Drug Use: No  .  Sexual Activity: No   Other Topics Concern  . Not on file   Social History Narrative  . No narrative on file     Family History  Problem Relation Age of Onset  . Tuberculosis Mother   . Heart disease Father      Review of Systems: General: negative for chills, fever, night sweats or weight changes.  Cardiovascular: negative for chest pain, edema, orthopnea, palpitations, paroxysmal nocturnal dyspnea, shortness of breath or dyspnea on exertion Dermatological: negative for rash Respiratory: negative for cough or wheezing Urologic: negative for hematuria Abdominal: negative for nausea, vomiting, diarrhea, bright red blood per rectum, melena, or hematemesis; no dysphagia; no reflux sxs Neurologic: negative for visual changes, syncope, or dizziness All other systems reviewed and are otherwise negative except as noted above.  Labs: No results found for this basename: CKTOTAL, CKMB, TROPONINI,  in the last 72 hours Lab Results  Component Value Date   WBC 5.5 09/06/2012   HGB 9.5* 09/06/2012   HCT 28.3* 09/06/2012   MCV 80.6 09/06/2012   PLT 437* 09/06/2012    Recent Labs Lab 09/06/12 0940  NA 137  K 4.5  CL 101  CO2 28  BUN 49*  CREATININE 1.19*  CALCIUM 9.1  GLUCOSE 142*   Lab Results  Component Value Date   CHOL 307* 06/10/2012   HDL 70.80 06/10/2012   LDLCALC 188* 07/02/2011   TRIG 109.0 06/10/2012   No results found for this basename: DDIMER    Radiology/Studies:  Dg Chest 2 View  09/06/2012   *RADIOLOGY REPORT*  Clinical Data: Chest pain.  Shortness of breath.  Right knee arthroplasty on 08/26/2012.  CHEST - 2 VIEW  Comparison: Two-view chest x-ray 08/26/2012, 09/11/2010, 07/24/2010.  Findings: Prior sternotomy for CABG and aortic valve replacement. Cardiac silhouette normal in size, unchanged.  Thoracic aorta atherosclerotic, unchanged.  Hilar and mediastinal contours otherwise unremarkable.  Lungs clear.  Bronchovascular markings normal.  Pulmonary vascularity  normal.  No pneumothorax.  No pleural effusions.  Degenerative changes involving the thoracic spine.  No significant interval change.  IMPRESSION: No acute cardiopulmonary disease.  Stable examination.   Original Report Authenticated By: Hulan Saas, M.D.    EKG: NSR 71 bpm; LAD; chronic NSST changes  Physical Exam: Blood pressure 131/52, pulse 78, temperature 98.3 F (36.8 C), temperature source Oral, resp. rate 23, height 5\' 1"  (1.549 m), weight 186 lb (84.369 kg), SpO2 100.00%. General:  78 year old female, obese, lying supine; in no acute distress. Head: Normocephalic, atraumatic, sclera non-icteric, no xanthomas, nares are without discharge.  Neck: Negative for carotid bruits. JVD not elevated. Lungs: Clear bilaterally to auscultation without wheezes, rales, or rhonchi.  Breathing is unlabored. Heart: RRR with S1 S2.  2-3/6 short systolic ejection murmur at base; no clicks. Abdomen: Soft, non-tender, non-distended with normoactive bowel sounds. No hepatomegaly. No rebound/guarding. No obvious abdominal masses. Msk: moderate tenderness with palpation of sternum  Extremities: 3+ LE edema, including about the knee; no erythema; tender to palpation; well healed incision overlying knee  Neuro: Alert and oriented X 3. Moves all extremities spontaneously. Psych:  Responds to questions appropriately with a normal affect.     Assessment and Plan:  1 Atypical chest pain   - most likely musculoskeletal etiology 2 CAD   - s/p 1v CABG (LIMA-LAD), 08/2010 3 Aortic stenosis  - status post represented aVR, 08/2010  4 Status post right TKR, 08/26/2012 5 Renal insufficiency  PLAN: The patient will be admitted for overnight observation and rule out of MI. Will also start gentle hydration for treatment of RI, with followup labs in a.m. No further workup indicated at present time.  Signed, SERPE, EUGENE PA-C 09/06/2012, 2:29 PM  As above, patient seen and examined. Briefly she is a 78 year old  female with a past medical history of coronary artery bypass graft as well as aortic valve replacement in August of 2012, diabetes mellitus, hypertension, hyperlipidemia and status post recent total knee replacement who presents with chest pain. She apparently has had intermittent chest pain for years. The pain is substernal. It lasts 1-2 minutes and resolves. It does not radiate and there are no associated symptoms. The pain is not pleuritic, positional or related to food. It is not exertional. She had pain yesterday and this morning and presented for evaluation. Presently pain-free. Electrocardiogram shows sinus rhythm with nonspecific ST-T wave changes. Initial troponin is negative. Symptoms are atypical and may be musculoskeletal. Plan to admit and rule out myocardial infarction with serial enzymes. If negative plan discharge tomorrow morning. Patient does not complain of dyspnea and I think pulmonary embolus is very unlikely but was considered given recent orthopedic surgery. Note her symptoms preceded her knee replacement. Continue preadmission medications. Patient also appears to have prerenal azotemia. We'll gently hydrate and repeat potassium and renal function tomorrow morning. Olga Millers

## 2012-09-06 NOTE — ED Provider Notes (Signed)
CSN: [id number]     Arrival date & time 09/06/12  8469 History     First MD Initiated Contact with Patient 09/06/12 [phone]     Chief Complaint  Patient presents with  . Chest Pain   (Consider location/radiation/quality/duration/timing/severity/associated sxs/prior Treatment) HPI  78 year old female with history of diabetes, hypertension, aortic stenosis status post aVR, recent right knee total replacement 11 days ago presents complaining of chest pain. Patient reports yesterday while she was washing herself in the bathroom she experiencing dull pain to her mid chest. Pain lasting for about a minute and resolved without any treatment. Patient states "I pay no mind to it". This morning when she went to wash herself in the bathroom she then experienced the same pain again except more severe. Described as a dull sensation to mid chest lasting for several minutes but resolved. There is no associated fever, cough, lightheadedness, dizziness, nausea, shortness of breath, diaphoresis. No complaint of abdominal pain, back pain, dysuria, or increasing pain at the surgical site. Patient did call EMS to bring her to the ER. Patient is currently taking Plavix for history of heart valve replacement. Patient is currently chest pain-free. Patient is allergic to aspirin.   Past Medical History  Diagnosis Date  . Diabetes mellitus   . Hypertension   . Aortic stenosis     s/p AVR August 2012  . Hypercholesterolemia   . OA (osteoarthritis)   . CAD (coronary artery disease)     s/p CABG x 15 August 2010  . Hyperlipidemia   . Degenerative arthritis of knee   . Obesity    Past Surgical History  Procedure Laterality Date  . Cardiac catheterization  07/25/2010    Dr Swaziland  . Coronary artery bypass graft  08/17/10    x1, Dr Laneta Simmers with LIMA to LAD  . Aortic valve replacement  08/17/2010    using a 21-mm Edwards pericardial Magna - Ease valve.   . Back surgery    . Total knee arthroplasty Right 08/26/2012    Dr Eulah Pont  . Total knee arthroplasty Right 08/26/2012    Procedure: TOTAL KNEE ARTHROPLASTY;  Surgeon: Loreta Ave, MD;  Location: [facility] OR;  Service: Orthopedics;  Laterality: Right;   Family History  Problem Relation Age of Onset  . Tuberculosis Mother   . Heart disease Father    History  Substance Use Topics  . Smoking status: Never Smoker   . Smokeless tobacco: Never Used  . Alcohol Use: Yes     Comment: rarely   OB History   Grav Para Term Preterm Abortions TAB SAB Ect Mult Living                 Review of Systems  All other systems reviewed and are negative.    Allergies  Aspirin; Crestor; Lipitor; and Norvasc  Home Medications   Current Outpatient Rx  Name  Route  Sig  Dispense  Refill  . acetaminophen (TYLENOL) 500 MG tablet   Oral   Take 500 mg by mouth every 6 (six) hours as needed.           Marland Kitchen aspirin EC 325 MG EC tablet   Oral   Take 1 tablet (325 mg total) by mouth daily with breakfast.   30 tablet   0   . bisacodyl (DULCOLAX) 5 MG EC tablet   Oral   Take 2 tablets (10 mg total) by mouth every morning.   30 tablet   0   .  clopidogrel (PLAVIX) 75 MG tablet   Oral   Take 75 mg by mouth daily.         Marland Kitchen docusate sodium 100 MG CAPS   Oral   Take 100 mg by mouth 2 (two) times daily.   10 capsule   0   . hydrochlorothiazide (HYDRODIURIL) 25 MG tablet   Oral   Take 25 mg by mouth daily.         Marland Kitchen lisinopril (PRINIVIL,ZESTRIL) 40 MG tablet   Oral   Take 40 mg by mouth daily.         . metFORMIN (GLUCOPHAGE) 1000 MG tablet   Oral   Take 1 tablet (1,000 mg total) by mouth 2 (two) times daily with a meal.   180 tablet   3   . metoprolol tartrate (LOPRESSOR) 25 MG tablet   Oral   Take 25 mg by mouth 2 (two) times daily.         Marland Kitchen oxyCODONE (OXY IR/ROXICODONE) 5 MG immediate release tablet      1-2 tablets every 4-6 hrs as needed for pain   100 tablet   0   . ezetimibe (ZETIA) 10 MG tablet   Oral   Take 1 tablet (10 mg  total) by mouth daily.   30 tablet   11   . fenofibrate micronized (LOFIBRA) 134 MG capsule   Oral   Take 134 mg by mouth daily before breakfast.         . valsartan-hydrochlorothiazide (DIOVAN-HCT) 320-25 MG per tablet   Oral   Take 1 tablet by mouth daily. PATIENT NEEDS OFFICE VISIT FOR ADDITIONAL REFILLS - 2nd NOTICE!   15 tablet   0    BP 141/48  Pulse 71  Temp(Src) 98.3 F (36.8 C) (Oral)  Resp 18  Ht 5\' 1"  (1.549 m)  Wt 186 lb (84.369 kg)  BMI 35.16 kg/m2  SpO2 100% Physical Exam  Nursing note and vitals reviewed. Constitutional: She is oriented to person, place, and time. She appears well-developed and well-nourished. No distress.  HENT:  Head: Atraumatic.  Eyes: Conjunctivae are normal.  Neck: No JVD present.  Cardiovascular: Normal rate and regular rhythm.   Murmur (3 out 6 systolic murmur) heard. Pulmonary/Chest: Effort normal and breath sounds normal. No respiratory distress. She has no wheezes. She has no rales.  Abdominal: Soft. There is no tenderness.  Musculoskeletal: She exhibits tenderness (fresh surgical scar on R knee. well healing, no evidence of infection, nontender).  Neurological: She is alert and oriented to person, place, and time.  Skin: Skin is warm. No rash noted.  Psychiatric: She has a normal mood and affect.    ED Course   Procedures (including critical care time)   Date: 09/06/2012  Rate: 71  Rhythm: normal sinus rhythm  QRS Axis: normal  Intervals: normal  ST/T Wave abnormalities: normal  Conduction Disutrbances: none  Narrative Interpretation:   Old EKG Reviewed: No significant changes noted     Pt here with intermittent cp since yesterday.  No c/o of SOB, DOE, or hemoptysis.  Had R total knee replacement 11 days ago.  Has cardiac history status post single vessel CABG with LIMA graft to the LAD in August 2012.  Currently sxs free, resting comfortably, not tachycardic and hemodynamically stable.    11:17 AM ECG shows no  acute ischemic changes.  Normal troponin.  Pt is a poor historian, although she denies any sxs to suggest ACS (nausea, diaphoresis, SOB), her daughter at  bedside sts she does have those sxs.  Pt also has elevate renal enzyme with BUN 49, Cr. 1.19, which his higher than her baseline.  Hgb 9.5, near her baseline.  CXR without acute changes.  Care discussed with attending.  Plan to consult cardiology.     1:50 PM i have consulted [facility], after multiple attempts to reach the cardiologist.  Cardiologist has returned phone call, and agrees to see pt in ER for admission for further evaluation of her chest pain. Pt and family member aware and agrees with plan.       Labs Reviewed  CBC WITH DIFFERENTIAL - Abnormal; Notable for the following:    RBC 3.51 (*)    Hemoglobin 9.5 (*)    HCT 28.3 (*)    RDW 15.7 (*)    Platelets 437 (*)    All other components within normal limits  BASIC METABOLIC PANEL - Abnormal; Notable for the following:    Glucose, Bld 142 (*)    BUN 49 (*)    Creatinine, Ser 1.19 (*)    GFR calc non Af Amer 44 (*)    GFR calc Af Amer 50 (*)    All other components within normal limits  POCT I-STAT TROPONIN I   Dg Chest 2 View  09/06/2012   *RADIOLOGY REPORT*  Clinical Data: Chest pain.  Shortness of breath.  Right knee arthroplasty on 08/26/2012.  CHEST - 2 VIEW  Comparison: Two-view chest x-ray 08/26/2012, 09/11/2010, 07/24/2010.  Findings: Prior sternotomy for CABG and aortic valve replacement. Cardiac silhouette normal in size, unchanged.  Thoracic aorta atherosclerotic, unchanged.  Hilar and mediastinal contours otherwise unremarkable.  Lungs clear.  Bronchovascular markings normal.  Pulmonary vascularity normal.  No pneumothorax.  No pleural effusions.  Degenerative changes involving the thoracic spine.  No significant interval change.  IMPRESSION: No acute cardiopulmonary disease.  Stable examination.   Original Report Authenticated By: Hulan Saas, M.D.   1.  Chest pain     MDM  BP 133/51  Pulse 70  Temp(Src) 98.3 F (36.8 C) (Oral)  Resp 16  Ht 5\' 1"  (1.549 m)  Wt 186 lb (84.369 kg)  BMI 35.16 kg/m2  SpO2 100%  I have reviewed nursing notes and vital signs. I personally reviewed the imaging tests through PACS system  I reviewed available ER/hospitalization records thought the EMR   Fayrene Helper, [place] 09/06/12 1352

## 2012-09-07 ENCOUNTER — Other Ambulatory Visit: Payer: Self-pay | Admitting: Nurse Practitioner

## 2012-09-07 DIAGNOSIS — R079 Chest pain, unspecified: Secondary | ICD-10-CM | POA: Diagnosis not present

## 2012-09-07 DIAGNOSIS — R0789 Other chest pain: Secondary | ICD-10-CM

## 2012-09-07 LAB — TROPONIN I
Troponin I: <0.3 ng/mL (ref <0.30)
Troponin I: <0.3 ng/mL (ref <0.30)

## 2012-09-07 LAB — GLUCOSE, CAPILLARY
Glucose-Capillary: 123 mg/dL — ABNORMAL HIGH (ref 70–99)
Glucose-Capillary: 155 mg/dL — ABNORMAL HIGH (ref 70–99)

## 2012-09-07 LAB — BASIC METABOLIC PANEL
Calcium: 9 mg/dL (ref 8.4–10.5)
GFR calc Af Amer: 65 mL/min — ABNORMAL LOW (ref >90)
GFR calc non Af Amer: 56 mL/min — ABNORMAL LOW (ref >90)
Sodium: 138 mEq/L (ref 135–145)

## 2012-09-07 NOTE — Progress Notes (Signed)
CSW (Clinical Child psychotherapist) placed pt discharge packet with shadow chart. Pt family requesting ambulance transport. CSW arranged transport with [facility] and notified facility. CSW signing off.   Clearence Vitug, LCSWA [phone]

## 2012-09-07 NOTE — Progress Notes (Signed)
Patient Name: [name] [name] Date of Encounter: 09/07/2012   Principal Problem:   Midsternal chest pain Active Problems:   CAD (coronary artery disease)   Hypertension   Diabetes mellitus type II, non insulin dependent   Hyperlipidemia   S/P AVR (aortic valve replacement)   SUBJECTIVE  No further chest pain.  Eager to go home.    CURRENT MEDS . aspirin EC  325 mg Oral Q breakfast  . bisacodyl  10 mg Oral q morning - 10a  . clopidogrel  75 mg Oral Daily  . docusate sodium  100 mg Oral BID  . enoxaparin (LOVENOX) injection  40 mg Subcutaneous Q24H  . ezetimibe  10 mg Oral Daily  . metoprolol tartrate  25 mg Oral BID  .  morphine injection  4 mg Intravenous Once  . ondansetron  4 mg Intravenous Once   OBJECTIVE  Filed Vitals:   09/06/12 1730 09/06/12 1816 09/06/12 2020 09/07/12 0515  BP: 104/53 150/53 134/41 133/45  Pulse: 78 80 82 67  Temp:  98.5 F (36.9 C) 98.3 F (36.8 C) 98.2 F (36.8 C)  TempSrc:  Oral    Resp: 12 18 16 15   Height:  5\' 1"  (1.549 m)    Weight:  183 lb 6.8 oz (83.2 kg)    SpO2: 100% 100% 98% 100%    Intake/Output Summary (Last 24 hours) at 09/07/12 1124 Last data filed at 09/07/12 0500  Gross per 24 hour  Intake 394.17 ml  Output    601 ml  Net -206.83 ml   Filed Weights   09/06/12 0906 09/06/12 1816  Weight: 186 lb (84.369 kg) 183 lb 6.8 oz (83.2 kg)   PHYSICAL EXAM  General: Pleasant, NAD. Neuro: Alert and oriented X 3. Moves all extremities spontaneously. Psych: Normal affect. HEENT:  Normal  Neck: Supple without bruits or JVD. Lungs:  Resp regular and unlabored, CTA. Heart: RRR no s3, s4,2/6 sem rusb. Abdomen: Soft, non-tender, non-distended, BS + x 4.  Extremities: No clubbing, cyanosis.  1+ edema or RLE to knee.  No edema on L.  Dsg to R knee d/i.   DP/PT/Radials 2+ and equal bilaterally.  Accessory Clinical Findings  CBC  Recent Labs  09/06/12 0940 09/06/12 1900  WBC 5.5 5.0  NEUTROABS 3.6  --   HGB 9.5* 9.5*   HCT 28.3* 28.9*  MCV 80.6 80.7  PLT 437* 418*   Basic Metabolic Panel  Recent Labs  09/06/12 0940 09/06/12 1900 09/07/12 0415  NA 137  --  138  K 4.5  --  4.1  CL 101  --  101  CO2 28  --  27  GLUCOSE 142*  --  134*  BUN 49*  --  34*  CREATININE 1.19* 1.03 0.97  CALCIUM 9.1  --  9.0   Cardiac Enzymes  Recent Labs  09/06/12 1900 09/07/12 0014 09/07/12 0415  TROPONINI <0.30 <0.30 <0.30   TELE  rsr  Radiology/Studies  Dg Chest 2 View  09/06/2012   *RADIOLOGY REPORT*  Clinical Data: Chest pain.  Shortness of breath.  Right knee arthroplasty on 08/26/2012.  CHEST - 2 VIEW  Comparison: Two-view chest x-ray 08/26/2012, 09/11/2010, 07/24/2010.  Findings: Prior sternotomy for CABG and aortic valve replacement. Cardiac silhouette normal in size, unchanged.  Thoracic aorta atherosclerotic, unchanged.  Hilar and mediastinal contours otherwise unremarkable.  Lungs clear.  Bronchovascular markings normal.  Pulmonary vascularity normal.  No pneumothorax.  No pleural effusions.  Degenerative changes involving the thoracic spine.  No significant interval change.  IMPRESSION: No acute cardiopulmonary disease.  Stable examination.   Original Report Authenticated By: Hulan Saas, M.D.   ASSESSMENT AND PLAN  1.  Midsternal Chest Pain/CAD: s/p CABG x 1 in 2012 @ the time of AVR.  Admitted yesterday with fleeting chest pain w/o assoc Ss.  No obj evidence of ischemia. Plan d/c today with early f/u and consideration for outpt stress testing.  Cont plavix (asa allergy), bb, zetia.  In absence of tachycardia/dyspnea, low suspicion for PE.  2.  HTN:  Stable.  On bb/acei/diuretic @ home.  Acei/diuretic held on admission 2/2 prerenal azotemia.  Plan to resume on d/c.  3.  HL:  On zetia.  Intolerant to multiple statins.  4.  S/p AVR: bioprosthetic.  No echo since 10/2010.  Consider f/u as outpt.  5.  Mild prerenal azotemia:  Improve with hydration.  6. Normocytic anemia:  Stable.  7.  DJD  s/p R TKA:  Currently staying @ [facility] for rehab/PT.  Signed, Nicolasa Ducking NP  History and all data above reviewed.  Patient examined.  I agree with the findings as above.  No further pain.  No SOB.  The patient exam reveals COR:RRR  ,  Lungs: Clear  ,  Abd: Positive bowel sounds, no rebound no guarding, Ext no edema  .  All available labs, radiology testing, previous records reviewed. Agree with documented assessment and plan. OK to discharge.  Plans for out patient Lexiscan Myoview.  Fayrene Fearing Ebonie Westerlund  12:24 PM  09/07/2012

## 2012-09-07 NOTE — Discharge Summary (Signed)
Patient ID: [name] [name],  MRN: [id number], DOB/AGE: 1934-12-01 78 y.o.  Admit date: 09/06/2012 Discharge date: 09/07/2012  Primary Care Provider: Shade Flood Primary Cardiologist: P. Swaziland, MD   Discharge Diagnoses Principal Problem:   Midsternal chest pain  **No objective evidence of ischemia on this admission.  Active Problems:   CAD (coronary artery disease)   Hypertension   Diabetes mellitus type II, non insulin dependent   Hyperlipidemia   S/P AVR (aortic valve replacement)  Allergies Allergies  Allergen Reactions  . Aspirin     On MAR  . Crestor [Rosuvastatin Calcium]     On MAR  . Lipitor [Atorvastatin Calcium]     On MAR  . Norvasc [Amlodipine Besylate]     On MAR   Procedures  None  History of Present Illness  78 y/o female with h/o CAD s/p CABG x 1, performed at the time of her bioprosthetic aortic valve replacement in 2012.  She recently underwent right total knee arthroplasty on 08/26/2012 and subsequently has been staying at [facility] for rehab.  She had been recovering well however on the day prior to admission, she began to experience intermittent midsternal chest pain without associated symptoms, lasting 1-2 mins and resolving spontaneously.  She was taken to the [facility] ED on 8/24, where ECG was non-acute and initial cardiac markers were negative.  She was observed for further evaluation.  Hospital Course  Following admission, [name] [name] remained pain free.  Cardiac markers remained normal.  In the absence of tachycardia and dyspnea, it was felt that PE was unlikely.  Further, in the absence of objective evidence of ischemia, it was felt that chest pain was non-cardiac.  She will be discharged back to [facility] today.  We have arranged for an outpatient Lexiscan Cardiolite on 9/11 with office follow-up on 9/15.  Discharge Vitals Blood pressure 101/39, pulse 75, temperature 98.1 F (36.7 C), temperature source Oral, resp. rate 16, height 5'  1" (1.549 m), weight 183 lb 6.8 oz (83.2 kg), SpO2 100.00%.  Filed Weights   09/06/12 0906 09/06/12 1816  Weight: 186 lb (84.369 kg) 183 lb 6.8 oz (83.2 kg)   Labs  CBC  Recent Labs  09/06/12 0940 09/06/12 1900  WBC 5.5 5.0  NEUTROABS 3.6  --   HGB 9.5* 9.5*  HCT 28.3* 28.9*  MCV 80.6 80.7  PLT 437* 418*   Basic Metabolic Panel  Recent Labs  09/06/12 0940 09/06/12 1900 09/07/12 0415  NA 137  --  138  K 4.5  --  4.1  CL 101  --  101  CO2 28  --  27  GLUCOSE 142*  --  134*  BUN 49*  --  34*  CREATININE 1.19* 1.03 0.97  CALCIUM 9.1  --  9.0   Cardiac Enzymes  Recent Labs  09/06/12 1900 09/07/12 0014 09/07/12 0415  TROPONINI <0.30 <0.30 <0.30   Disposition  Pt is being discharged home today in good condition.  Follow-up Plans & Appointments      Follow-up Information   Follow up with Norma Fredrickson, NP On 09/28/2012. (1:30 PM)    Specialty:  Nurse Practitioner   Contact information:   [address] [place] [address] [phone]       Follow up with Shade Flood, MD. (as scheduled.)    Specialty:  Sports Medicine   Contact information:   [address] [place] [address] [phone]      Discharge Medications  Medication List    STOP taking these medications       oxyCODONE 5 MG immediate release tablet  Commonly known as:  Oxy IR/ROXICODONE     valsartan-hydrochlorothiazide 320-25 MG per tablet  Commonly known as:  DIOVAN-HCT      TAKE these medications       acetaminophen 500 MG tablet  Commonly known as:  TYLENOL  Take 500 mg by mouth every 6 (six) hours as needed.     aspirin 325 MG EC tablet  Take 1 tablet (325 mg total) by mouth daily with breakfast.     bisacodyl 5 MG EC tablet  Commonly known as:  DULCOLAX  Take 2 tablets (10 mg total) by mouth every morning.     clopidogrel 75 MG tablet  Commonly known as:  PLAVIX  Take 75 mg by mouth daily.     DSS 100 MG Caps  Take 100 mg by mouth  2 (two) times daily.     ezetimibe 10 MG tablet  Commonly known as:  ZETIA  Take 1 tablet (10 mg total) by mouth daily.     fenofibrate micronized 134 MG capsule  Commonly known as:  LOFIBRA  Take 134 mg by mouth daily before breakfast.     hydrochlorothiazide 25 MG tablet  Commonly known as:  HYDRODIURIL  Take 25 mg by mouth daily.     lisinopril 40 MG tablet  Commonly known as:  PRINIVIL,ZESTRIL  Take 40 mg by mouth daily.     metFORMIN 1000 MG tablet  Commonly known as:  GLUCOPHAGE  Take 1 tablet (1,000 mg total) by mouth 2 (two) times daily with a meal.     metoprolol tartrate 25 MG tablet  Commonly known as:  LOPRESSOR  Take 25 mg by mouth 2 (two) times daily.      Outstanding Labs/Studies  Lexiscan Cardiolite scheduled for 09/24/2012.  Duration of Discharge Encounter   Greater than 30 minutes including physician time.  SignedNicolasa Ducking NP 09/07/2012, 12:24 PM

## 2012-09-07 NOTE — Progress Notes (Addendum)
Clinical Social Work Department BRIEF PSYCHOSOCIAL ASSESSMENT 09/07/2012  Patient:  [name] [name], [name] [name]     Account Number:  [id number]     Admit date:  09/06/2012  Clinical Social Worker:  Harless Nakayama  Date/Time:  09/07/2012 10:30 AM  Referred by:  Physician  Date Referred:  09/07/2012 Referred for  SNF Placement   Other Referral:   Interview type:  Family Other interview type:   CSW spoke with pt daughter.    PSYCHOSOCIAL DATA Living Status:  FACILITY Admitted from facility:  [facility] Level of care:  Skilled Nursing Facility Primary support name:  Rito Ehrlich [phone] Primary support relationship to patient:  SIBLING Degree of support available:   Pt has good family support.    CURRENT CONCERNS Current Concerns  Post-Acute Placement   Other Concerns:    SOCIAL WORK ASSESSMENT / PLAN CSW (Clinical Child psychotherapist) informed that pt is from [facility]. CSW spoke with pt family and they would like for pt to return to facility at dc. Pt family may be able to provide transportation depending on time of dc but will inform CSW when actual dc day and time is set. CSW confirmed with facility that pt is able to return.   Assessment/plan status:  Psychosocial Support/Ongoing Assessment of Needs Other assessment/ plan:   Information/referral to community resources:   None needed at this time    PATIENT'S/FAMILY'S RESPONSE TO PLAN OF CARE: Pt family is agreeable for pt to dc back to facility      [employer], LCSWA [phone]

## 2012-09-08 ENCOUNTER — Encounter: Payer: Self-pay | Admitting: Adult Health

## 2012-09-09 ENCOUNTER — Non-Acute Institutional Stay (SKILLED_NURSING_FACILITY): Payer: Medicare Other | Admitting: Internal Medicine

## 2012-09-09 DIAGNOSIS — I2510 Atherosclerotic heart disease of native coronary artery without angina pectoris: Secondary | ICD-10-CM

## 2012-09-09 DIAGNOSIS — I10 Essential (primary) hypertension: Secondary | ICD-10-CM | POA: Diagnosis not present

## 2012-09-09 DIAGNOSIS — M1710 Unilateral primary osteoarthritis, unspecified knee: Secondary | ICD-10-CM

## 2012-09-09 DIAGNOSIS — E1059 Type 1 diabetes mellitus with other circulatory complications: Secondary | ICD-10-CM | POA: Diagnosis not present

## 2012-09-09 NOTE — ED Provider Notes (Signed)
Medical screening examination/treatment/procedure(s) were conducted as a shared visit with non-physician practitioner(s) and myself.  I personally evaluated the patient during the encounter  78 yo female with hx of CAD presenting after episode of chest pain.  Nontoxic, elderly appearing.  RRR, 3/6 systolic murmur.  Admitted to cardiology for ACS rule out.   Clinical Impression: 1. Chest pain       Candyce Churn, MD 09/09/12 [phone]

## 2012-09-09 NOTE — ED Provider Notes (Signed)
Medical screening examination/treatment/procedure(s) were conducted as a shared visit with non-physician practitioner(s) and myself.  I personally evaluated the patient during the encounter.   Please see my separate note.     Candyce Churn, MD 09/09/12 [phone]

## 2012-09-17 ENCOUNTER — Encounter: Payer: Self-pay | Admitting: Cardiovascular Disease

## 2012-09-17 ENCOUNTER — Encounter: Payer: Self-pay | Admitting: Adult Health

## 2012-09-18 DIAGNOSIS — Z96659 Presence of unspecified artificial knee joint: Secondary | ICD-10-CM | POA: Diagnosis not present

## 2012-09-18 DIAGNOSIS — E119 Type 2 diabetes mellitus without complications: Secondary | ICD-10-CM | POA: Diagnosis not present

## 2012-09-18 DIAGNOSIS — I10 Essential (primary) hypertension: Secondary | ICD-10-CM | POA: Diagnosis not present

## 2012-09-18 DIAGNOSIS — I2510 Atherosclerotic heart disease of native coronary artery without angina pectoris: Secondary | ICD-10-CM | POA: Diagnosis not present

## 2012-09-18 DIAGNOSIS — Z471 Aftercare following joint replacement surgery: Secondary | ICD-10-CM | POA: Diagnosis not present

## 2012-09-18 DIAGNOSIS — M159 Polyosteoarthritis, unspecified: Secondary | ICD-10-CM | POA: Diagnosis not present

## 2012-09-19 ENCOUNTER — Ambulatory Visit (INDEPENDENT_AMBULATORY_CARE_PROVIDER_SITE_OTHER): Payer: Medicare Other | Admitting: Family Medicine

## 2012-09-19 VITALS — BP 136/78 | HR 84 | Temp 98.6°F | Resp 16 | Ht 62.0 in | Wt 181.2 lb

## 2012-09-19 DIAGNOSIS — I10 Essential (primary) hypertension: Secondary | ICD-10-CM | POA: Diagnosis not present

## 2012-09-19 DIAGNOSIS — E119 Type 2 diabetes mellitus without complications: Secondary | ICD-10-CM

## 2012-09-19 DIAGNOSIS — E7800 Pure hypercholesterolemia, unspecified: Secondary | ICD-10-CM

## 2012-09-19 MED ORDER — LISINOPRIL 40 MG PO TABS: 40.0000 mg | ORAL_TABLET | Freq: Every day | ORAL | Status: DC

## 2012-09-19 MED ORDER — METOPROLOL TARTRATE 25 MG PO TABS: 25.0000 mg | ORAL_TABLET | Freq: Two times a day (BID) | ORAL | Status: DC

## 2012-09-19 MED ORDER — HYDROCHLOROTHIAZIDE 25 MG PO TABS: 25.0000 mg | ORAL_TABLET | Freq: Every day | ORAL | Status: DC

## 2012-09-19 MED ORDER — CLOPIDOGREL BISULFATE 75 MG PO TABS: 75.0000 mg | ORAL_TABLET | Freq: Every day | ORAL | Status: DC

## 2012-09-19 NOTE — Patient Instructions (Signed)
You need to have your cholesterol checked. Lets plan to do this at your visit with your cardiology visit next week so plan to be fasting before the visit - do not eat anything for 6 to 8 hours before hand.  You should get your cholesterol medications (the zetia and the lofibra) from your heart doctor as they may want to change your dose depending on your blood results. You should have a years supply of metformin sent into your pharmacy already less then a month ago.

## 2012-09-19 NOTE — Progress Notes (Signed)
Subjective:    Patient ID: [name] [name], female    DOB: 05-09-1934, 78 y.o.   MRN: [id number] Chief Complaint  Patient presents with  . Medication Refill    needs to be back where she was before her surgery & stay at [facility]    HPI  Ms. Reynold is living in [place] but was a truck driver for many years so always came here to [facility] for her medical care and wants to continue.  She recently had a right total knee replacement. During her time at [facility] for rehab/PT, she did have an episode of CP so was transferred back to [facility] hosp o/n for cardiology eval and MI r/o - she was not having an acute cardiac event so was sent back to [facility] to finish her PT and was just d/c'd from there to home this week.  Is here for refill of her chronic meds. Has nuclear stress test and cardiology f/u sched for next wk. Ms. Balbach is here today with her daughter. She reports that she seems to be recuperating very well from her knee surgery.  Walking back to normal but using cane still just in case.  She is currently staying with a different daughter who just lives down the block from her and she hopes to go back to her own home - lives alone - in sev wks.   Past Medical History  Diagnosis Date  . Diabetes mellitus   . Hypertension   . Aortic stenosis     s/p AVR August 2012  . Hypercholesterolemia   . OA (osteoarthritis)   . CAD (coronary artery disease)     s/p CABG x 15 August 2010  . Hyperlipidemia   . Degenerative arthritis of knee   . Obesity     Current Outpatient Prescriptions on File Prior to Visit  Medication Sig Dispense Refill  . clopidogrel (PLAVIX) 75 MG tablet Take 75 mg by mouth daily.      Marland Kitchen ezetimibe (ZETIA) 10 MG tablet Take 1 tablet (10 mg total) by mouth daily.  30 tablet  11  . metFORMIN (GLUCOPHAGE) 1000 MG tablet Take 1 tablet (1,000 mg total) by mouth 2 (two) times daily with a meal.  180 tablet  3  . metoprolol tartrate (LOPRESSOR) 25 MG tablet Take 25 mg by mouth 2  (two) times daily.      Marland Kitchen acetaminophen (TYLENOL) 500 MG tablet Take 500 mg by mouth every 6 (six) hours as needed.        Marland Kitchen aspirin EC 325 MG EC tablet Take 1 tablet (325 mg total) by mouth daily with breakfast.  30 tablet  0  . bisacodyl (DULCOLAX) 5 MG EC tablet Take 2 tablets (10 mg total) by mouth every morning.  30 tablet  0  . docusate sodium 100 MG CAPS Take 100 mg by mouth 2 (two) times daily.  10 capsule  0  . fenofibrate micronized (LOFIBRA) 134 MG capsule Take 134 mg by mouth daily before breakfast.      . hydrochlorothiazide (HYDRODIURIL) 25 MG tablet Take 25 mg by mouth daily.      Marland Kitchen lisinopril (PRINIVIL,ZESTRIL) 40 MG tablet Take 40 mg by mouth daily.       No current facility-administered medications on file prior to visit.   Allergies  Allergen Reactions  . Aspirin     On MAR  . Crestor [Rosuvastatin Calcium]     On MAR  . Lipitor [Atorvastatin Calcium]  On MAR  . Norvasc [Amlodipine Besylate]     On MAR   Review of Systems  Constitutional: Positive for fatigue. Negative for fever, chills, diaphoresis and appetite change.  Eyes: Negative for visual disturbance.  Respiratory: Negative for cough and shortness of breath.   Cardiovascular: Negative for chest pain, palpitations and leg swelling.  Genitourinary: Negative for decreased urine volume.  Musculoskeletal: Positive for joint swelling, arthralgias and gait problem.  Skin: Negative for rash.  Neurological: Negative for syncope and headaches.  Hematological: Does not bruise/bleed easily.      BP 136/78  Pulse 84  Temp(Src) 98.6 F (37 C) (Oral)  Resp 16  Ht 5\' 2"  (1.575 m)  Wt 181 lb 3.2 oz (82.192 kg)  BMI 33.13 kg/m2  SpO2 100% Objective:   Physical Exam  Constitutional: She is oriented to person, place, and time. She appears well-developed and well-nourished. No distress.  HENT:  Head: Normocephalic and atraumatic.  Right Ear: External ear normal.  Left Ear: External ear normal.  Eyes:  Conjunctivae are normal. No scleral icterus.  Neck: Normal range of motion. Neck supple. No thyromegaly present.  Cardiovascular: Normal rate, regular rhythm, normal heart sounds and intact distal pulses.   Pulmonary/Chest: Effort normal and breath sounds normal. No respiratory distress.  Musculoskeletal: She exhibits no edema.  Lymphadenopathy:    She has no cervical adenopathy.  Neurological: She is alert and oriented to person, place, and time.  Skin: Skin is warm and dry. She is not diaphoretic. No erythema.  Psychiatric: She has a normal mood and affect. Her behavior is normal.          Results for orders placed in visit on 09/19/12  POCT GLYCOSYLATED HEMOGLOBIN (HGB A1C)      Result Value Range   Hemoglobin A1C 6.3     Assessment & Plan:  Hypercholesterolemia - Plan: POCT glycosylated hemoglobin (Hb A1C), Comprehensive metabolic panel - followed by her cardiologist - has a nuclear stress test next wk w/ f/u in 2 wks. Rec to pt that she be fasting at that time as may need lipids recheck and will defer refill of her cholesterol medications - zetia and poss lofibra (pt states she taking but don't see a rx for it) - to cardiology.  Diabetes - Plan: POCT glycosylated hemoglobin (Hb A1C), Comprehensive metabolic panel - under excellent control, continue metformin.  A yrs supply was rx'ed last wk by other physician.  Hypertension - adequately controlled on lisinopril 40 and hctz 25 and metoprolol 25 bid.  CAD - cont plavix, refilled. Cont bb, acei, intol of statin.  Meds ordered this encounter  Medications  . clopidogrel (PLAVIX) 75 MG tablet    Sig: Take 1 tablet (75 mg total) by mouth daily.    Dispense:  90 tablet    Refill:  3  . hydrochlorothiazide (HYDRODIURIL) 25 MG tablet    Sig: Take 1 tablet (25 mg total) by mouth daily.    Dispense:  90 tablet    Refill:  3  . lisinopril (PRINIVIL,ZESTRIL) 40 MG tablet    Sig: Take 1 tablet (40 mg total) by mouth daily.     Dispense:  90 tablet    Refill:  3  . metoprolol tartrate (LOPRESSOR) 25 MG tablet    Sig: Take 1 tablet (25 mg total) by mouth 2 (two) times daily.    Dispense:  180 tablet    Refill:  3

## 2012-09-20 LAB — COMPREHENSIVE METABOLIC PANEL
AST: 17 U/L (ref 0–37)
Alkaline Phosphatase: 72 U/L (ref 39–117)
BUN: 29 mg/dL — ABNORMAL HIGH (ref 6–23)
Calcium: 9.2 mg/dL (ref 8.4–10.5)
Creat: 1.16 mg/dL — ABNORMAL HIGH (ref 0.50–1.10)

## 2012-09-21 ENCOUNTER — Encounter: Payer: Self-pay | Admitting: Family Medicine

## 2012-09-21 DIAGNOSIS — E119 Type 2 diabetes mellitus without complications: Secondary | ICD-10-CM | POA: Diagnosis not present

## 2012-09-21 DIAGNOSIS — Z96659 Presence of unspecified artificial knee joint: Secondary | ICD-10-CM | POA: Diagnosis not present

## 2012-09-21 DIAGNOSIS — M159 Polyosteoarthritis, unspecified: Secondary | ICD-10-CM | POA: Diagnosis not present

## 2012-09-21 DIAGNOSIS — Z471 Aftercare following joint replacement surgery: Secondary | ICD-10-CM | POA: Diagnosis not present

## 2012-09-21 DIAGNOSIS — I10 Essential (primary) hypertension: Secondary | ICD-10-CM | POA: Diagnosis not present

## 2012-09-21 DIAGNOSIS — I2510 Atherosclerotic heart disease of native coronary artery without angina pectoris: Secondary | ICD-10-CM | POA: Diagnosis not present

## 2012-09-22 DIAGNOSIS — I2510 Atherosclerotic heart disease of native coronary artery without angina pectoris: Secondary | ICD-10-CM | POA: Diagnosis not present

## 2012-09-22 DIAGNOSIS — I10 Essential (primary) hypertension: Secondary | ICD-10-CM | POA: Diagnosis not present

## 2012-09-22 DIAGNOSIS — M159 Polyosteoarthritis, unspecified: Secondary | ICD-10-CM | POA: Diagnosis not present

## 2012-09-22 DIAGNOSIS — E119 Type 2 diabetes mellitus without complications: Secondary | ICD-10-CM | POA: Diagnosis not present

## 2012-09-22 DIAGNOSIS — Z96659 Presence of unspecified artificial knee joint: Secondary | ICD-10-CM | POA: Diagnosis not present

## 2012-09-22 DIAGNOSIS — Z471 Aftercare following joint replacement surgery: Secondary | ICD-10-CM | POA: Diagnosis not present

## 2012-09-23 DIAGNOSIS — M159 Polyosteoarthritis, unspecified: Secondary | ICD-10-CM | POA: Diagnosis not present

## 2012-09-23 DIAGNOSIS — I2510 Atherosclerotic heart disease of native coronary artery without angina pectoris: Secondary | ICD-10-CM | POA: Diagnosis not present

## 2012-09-23 DIAGNOSIS — E119 Type 2 diabetes mellitus without complications: Secondary | ICD-10-CM | POA: Diagnosis not present

## 2012-09-23 DIAGNOSIS — I10 Essential (primary) hypertension: Secondary | ICD-10-CM | POA: Diagnosis not present

## 2012-09-23 DIAGNOSIS — Z471 Aftercare following joint replacement surgery: Secondary | ICD-10-CM | POA: Diagnosis not present

## 2012-09-23 DIAGNOSIS — Z96659 Presence of unspecified artificial knee joint: Secondary | ICD-10-CM | POA: Diagnosis not present

## 2012-09-24 ENCOUNTER — Ambulatory Visit (HOSPITAL_COMMUNITY): Payer: Medicare Other | Attending: Cardiology | Admitting: Radiology

## 2012-09-24 VITALS — BP 143/67 | HR 73 | Ht 61.0 in | Wt 183.0 lb

## 2012-09-24 DIAGNOSIS — R079 Chest pain, unspecified: Secondary | ICD-10-CM | POA: Insufficient documentation

## 2012-09-24 DIAGNOSIS — Z951 Presence of aortocoronary bypass graft: Secondary | ICD-10-CM | POA: Insufficient documentation

## 2012-09-24 DIAGNOSIS — R5381 Other malaise: Secondary | ICD-10-CM | POA: Insufficient documentation

## 2012-09-24 DIAGNOSIS — E119 Type 2 diabetes mellitus without complications: Secondary | ICD-10-CM | POA: Insufficient documentation

## 2012-09-24 DIAGNOSIS — R0789 Other chest pain: Secondary | ICD-10-CM

## 2012-09-24 DIAGNOSIS — M159 Polyosteoarthritis, unspecified: Secondary | ICD-10-CM | POA: Diagnosis not present

## 2012-09-24 DIAGNOSIS — E669 Obesity, unspecified: Secondary | ICD-10-CM | POA: Diagnosis not present

## 2012-09-24 DIAGNOSIS — Z471 Aftercare following joint replacement surgery: Secondary | ICD-10-CM | POA: Diagnosis not present

## 2012-09-24 DIAGNOSIS — I10 Essential (primary) hypertension: Secondary | ICD-10-CM | POA: Insufficient documentation

## 2012-09-24 DIAGNOSIS — I2510 Atherosclerotic heart disease of native coronary artery without angina pectoris: Secondary | ICD-10-CM

## 2012-09-24 DIAGNOSIS — Z96659 Presence of unspecified artificial knee joint: Secondary | ICD-10-CM | POA: Diagnosis not present

## 2012-09-24 MED ORDER — TECHNETIUM TC 99M SESTAMIBI GENERIC - CARDIOLITE
10.0000 | Freq: Once | INTRAVENOUS | Status: AC | PRN
  Administered 2012-09-24: 10 via INTRAVENOUS

## 2012-09-24 MED ORDER — REGADENOSON 0.4 MG/5ML IV SOLN
0.4000 mg | Freq: Once | INTRAVENOUS | Status: AC
  Administered 2012-09-24: 0.4 mg via INTRAVENOUS

## 2012-09-24 MED ORDER — TECHNETIUM TC 99M SESTAMIBI GENERIC - CARDIOLITE
30.0000 | Freq: Once | INTRAVENOUS | Status: AC | PRN
  Administered 2012-09-24: 30 via INTRAVENOUS

## 2012-09-24 NOTE — Progress Notes (Signed)
[facility] SITE 3 NUCLEAR MED [address] [place], [place] [address] [phone]    Cardiology Nuclear Med Study  [name] [name] is a 78 y.o. female     MRN : [id number]     DOB: 11-Aug-1934  Procedure Date: 09/24/2012  Nuclear Med Background Indication for Stress Test:  Evaluation for Ischemia, Graft Patency and Post Hospital on 09/06/12 with Chest Pain and Negative Enzymes History:  '05 MPS:no ischemia, EF=73%; '12 CABG/AVR; '12 Echo:EF=55% Cardiac Risk Factors: Hypertension, Lipids, NIDDM and Obesity  Symptoms:  Chest Pressure.  (last episode of chest discomfort:none since discharge from hospital) and Fatigue   Nuclear Pre-Procedure Caffeine/Decaff Intake:  None NPO After: 8:00pm   Lungs:  Clear. O2 Sat: 99% on room air. IV 0.9% NS with Angio Cath:  20g  IV Site: R Antecubital  IV Started by:  Stanton Kidney, EMT-P  Chest Size (in):  42 Cup Size: B  Height: 5\' 1"  (1.549 m)  Weight:  183 lb (83.008 kg)  BMI:  Body mass index is 34.6 kg/(m^2). Tech Comments:  Med's not taken this day, per patient.    Nuclear Med Study 1 or 2 day study: 1 day  Stress Test Type:  Lexiscan  Reading MD: Cassell Clement, MD  Order Authorizing Provider:  Peter Swaziland, MD  Resting Radionuclide: Technetium 33m Sestamibi  Resting Radionuclide Dose: 11.0 mCi   Stress Radionuclide:  Technetium 55m Sestamibi  Stress Radionuclide Dose: 33.0 mCi           Stress Protocol Rest HR: 73 Stress HR: 109  Rest BP: 143/67 Stress BP: 163/55  Exercise Time (min): n/a METS: n/a   Predicted Max HR: 145 bpm % Max HR: 75.17 bpm Rate Pressure Product: 13244   Dose of Adenosine (mg):  n/a Dose of Lexiscan: 0.4 mg  Dose of Atropine (mg): n/a Dose of Dobutamine: n/a mcg/kg/min (at max HR)  Stress Test Technologist: Smiley Houseman, CMA-N  Nuclear Technologist:  Domenic Polite, CNMT     Rest Procedure:  Myocardial perfusion imaging was performed at rest 45 minutes following the intravenous  administration of Technetium 55m Sestamibi.  Rest ECG: NSR with non-specific ST-T wave changes  Stress Procedure:  The patient received IV Lexiscan 0.4 mg over 15-seconds.  Technetium 35m Sestamibi injected at 30-seconds.  She c/o shortness of breath and headache with Lexiscan.  Quantitative spect images were obtained after a 45 minute delay.  Stress ECG: No significant change from baseline ECG  QPS Raw Data Images:  Normal; no motion artifact; normal heart/lung ratio. Stress Images:  Normal homogeneous uptake in all areas of the myocardium. Rest Images:  Normal homogeneous uptake in all areas of the myocardium. Subtraction (SDS):  No evidence of ischemia. Transient Ischemic Dilatation (Normal <1.22):  n/a Lung/Heart Ratio (Normal <0.45):  0.30  Quantitative Gated Spect Images QGS EDV:  93 ml QGS ESV:  38 ml  Impression Exercise Capacity:  Lexiscan with no exercise. BP Response:  Normal blood pressure response. Clinical Symptoms:  No chest pain. ECG Impression:  No significant ST segment change suggestive of ischemia. Comparison with Prior Nuclear Study: No significant change from previous study  Overall Impression:  Normal stress nuclear study.  LV Ejection Fraction: 59%.  LV Wall Motion:  NL LV Function; NL Wall Motion  [employer]

## 2012-09-25 ENCOUNTER — Other Ambulatory Visit (HOSPITAL_COMMUNITY): Payer: Self-pay | Admitting: Radiology

## 2012-09-25 DIAGNOSIS — E119 Type 2 diabetes mellitus without complications: Secondary | ICD-10-CM | POA: Diagnosis not present

## 2012-09-25 DIAGNOSIS — I10 Essential (primary) hypertension: Secondary | ICD-10-CM | POA: Diagnosis not present

## 2012-09-25 DIAGNOSIS — R079 Chest pain, unspecified: Secondary | ICD-10-CM

## 2012-09-25 DIAGNOSIS — M159 Polyosteoarthritis, unspecified: Secondary | ICD-10-CM | POA: Diagnosis not present

## 2012-09-25 DIAGNOSIS — Z96659 Presence of unspecified artificial knee joint: Secondary | ICD-10-CM | POA: Diagnosis not present

## 2012-09-25 DIAGNOSIS — Z471 Aftercare following joint replacement surgery: Secondary | ICD-10-CM | POA: Diagnosis not present

## 2012-09-25 DIAGNOSIS — I2510 Atherosclerotic heart disease of native coronary artery without angina pectoris: Secondary | ICD-10-CM | POA: Diagnosis not present

## 2012-09-25 NOTE — Addendum Note (Signed)
Addended by: Milinda Antis on: 09/25/2012 08:48 AM   Modules accepted: Orders

## 2012-09-28 ENCOUNTER — Ambulatory Visit (INDEPENDENT_AMBULATORY_CARE_PROVIDER_SITE_OTHER): Payer: Medicare Other | Admitting: Nurse Practitioner

## 2012-09-28 ENCOUNTER — Encounter: Payer: Self-pay | Admitting: Nurse Practitioner

## 2012-09-28 VITALS — BP 150/80 | HR 72 | Ht 61.0 in | Wt 183.8 lb

## 2012-09-28 DIAGNOSIS — I2510 Atherosclerotic heart disease of native coronary artery without angina pectoris: Secondary | ICD-10-CM | POA: Diagnosis not present

## 2012-09-28 DIAGNOSIS — E119 Type 2 diabetes mellitus without complications: Secondary | ICD-10-CM | POA: Diagnosis not present

## 2012-09-28 DIAGNOSIS — I10 Essential (primary) hypertension: Secondary | ICD-10-CM | POA: Diagnosis not present

## 2012-09-28 DIAGNOSIS — Z96659 Presence of unspecified artificial knee joint: Secondary | ICD-10-CM | POA: Diagnosis not present

## 2012-09-28 DIAGNOSIS — Z471 Aftercare following joint replacement surgery: Secondary | ICD-10-CM | POA: Diagnosis not present

## 2012-09-28 DIAGNOSIS — M159 Polyosteoarthritis, unspecified: Secondary | ICD-10-CM | POA: Diagnosis not present

## 2012-09-28 LAB — HEPATIC FUNCTION PANEL
ALT: 12 U/L (ref 0–35)
AST: 16 U/L (ref 0–37)
Albumin: 3.4 g/dL — ABNORMAL LOW (ref 3.5–5.2)
Alkaline Phosphatase: 78 U/L (ref 39–117)
Bilirubin, Direct: 0 mg/dL (ref 0.0–0.3)
Total Bilirubin: 0.3 mg/dL (ref 0.3–1.2)
Total Protein: 6.6 g/dL (ref 6.0–8.3)

## 2012-09-28 LAB — CBC WITH DIFFERENTIAL/PLATELET
Basophils Absolute: 0 10*3/uL (ref 0.0–0.1)
Basophils Relative: 0.7 % (ref 0.0–3.0)
Eosinophils Absolute: 0.1 10*3/uL (ref 0.0–0.7)
Eosinophils Relative: 1 % (ref 0.0–5.0)
HCT: 29.9 % — ABNORMAL LOW (ref 36.0–46.0)
Hemoglobin: 9.8 g/dL — ABNORMAL LOW (ref 12.0–15.0)
Lymphocytes Relative: 25.6 % (ref 12.0–46.0)
Lymphs Abs: 1.4 10*3/uL (ref 0.7–4.0)
MCHC: 32.9 g/dL (ref 30.0–36.0)
MCV: 79.8 fl (ref 78.0–100.0)
Monocytes Absolute: 0.4 10*3/uL (ref 0.1–1.0)
Monocytes Relative: 7.6 % (ref 3.0–12.0)
Neutro Abs: 3.5 10*3/uL (ref 1.4–7.7)
Neutrophils Relative %: 65.1 % (ref 43.0–77.0)
Platelets: 192 10*3/uL (ref 150.0–400.0)
RBC: 3.75 Mil/uL — ABNORMAL LOW (ref 3.87–5.11)
RDW: 16.6 % — ABNORMAL HIGH (ref 11.5–14.6)
WBC: 5.4 10*3/uL (ref 4.5–10.5)

## 2012-09-28 LAB — BASIC METABOLIC PANEL
BUN: 8 mg/dL (ref 6–23)
CO2: 27 mEq/L (ref 19–32)
Calcium: 8.9 mg/dL (ref 8.4–10.5)
Chloride: 105 mEq/L (ref 96–112)
Creatinine, Ser: 0.9 mg/dL (ref 0.4–1.2)
GFR: 78.29 mL/min (ref >60.00)
Glucose, Bld: 121 mg/dL — ABNORMAL HIGH (ref 70–99)
Potassium: 4.2 mEq/L (ref 3.5–5.1)
Sodium: 138 mEq/L (ref 135–145)

## 2012-09-28 LAB — LIPID PANEL
Cholesterol: 263 mg/dL — ABNORMAL HIGH (ref 0–200)
HDL: 57 mg/dL (ref >39.00)
Total CHOL/HDL Ratio: 5
Triglycerides: 183 mg/dL — ABNORMAL HIGH (ref 0.0–149.0)
VLDL: 36.6 mg/dL (ref 0.0–40.0)

## 2012-09-28 LAB — LDL CHOLESTEROL, DIRECT: Direct LDL: 169.9 mg/dL

## 2012-09-28 NOTE — Patient Instructions (Addendum)
Stay on your current medicines  We will check your labs today and then I will decide about your cholesterol - I will either refill the Zetia and/or fenofibrate OR put you on a totally different medicine  See Dr. Swaziland in 6 months  Take your medicine when you get home today  Call the [facility] office at [phone] if you have any questions, problems or concerns.

## 2012-09-28 NOTE — Progress Notes (Signed)
[name] [name] Date of Birth: Mar 14, 1934 Medical Record #[id number]  History of Present Illness: Ms. Ogden is seen back today for a post hospital visit. Seen for Dr. Swaziland. She has known CAD with past CABG x 1 at the time of AVR back in 2012. Her other issues include HLD, HTN, DM and OA.  Most recently has had knee surgery and went to [facility] for rehab. While there, developed chest pain. Was sent to the ER. Enzymes negative. No objective evidence of ischemia. Not felt to have PE due to absence of tachycardia and dyspnea. Outpatient stress testing was ordered.   Comes back today. Here with her son-in-law. She says she is doing ok. Now back home for the last 1 to 2 weeks. No chest pain. Not short of breath. Still with some swelling in the right knee but using a cane. Stress test results were given to her. She is not sure about her medicines. Has not had anything today. We called the drug store - she has not picked up her Zetia since December and hasn't picked up the fenofibrate since May. She is fasting today. Not using much pain medicine.    Current Outpatient Prescriptions  Medication Sig Dispense Refill  . clopidogrel (PLAVIX) 75 MG tablet Take 1 tablet (75 mg total) by mouth daily.  90 tablet  3  . hydrochlorothiazide (HYDRODIURIL) 25 MG tablet Take 1 tablet (25 mg total) by mouth daily.  90 tablet  3  . lisinopril (PRINIVIL,ZESTRIL) 40 MG tablet Take 1 tablet (40 mg total) by mouth daily.  90 tablet  3  . metFORMIN (GLUCOPHAGE) 1000 MG tablet Take 1 tablet (1,000 mg total) by mouth 2 (two) times daily with a meal.  180 tablet  3  . metoprolol tartrate (LOPRESSOR) 25 MG tablet Take 1 tablet (25 mg total) by mouth 2 (two) times daily.  180 tablet  3  . ezetimibe (ZETIA) 10 MG tablet Take 1 tablet (10 mg total) by mouth daily.  30 tablet  11  . fenofibrate micronized (LOFIBRA) 134 MG capsule Take 134 mg by mouth daily before breakfast.      . oxyCODONE-acetaminophen  (PERCOCET/ROXICET) 5-325 MG per tablet Take 1 tablet by mouth every 8 (eight) hours as needed for pain.       No current facility-administered medications for this visit.    Allergies  Allergen Reactions  . Aspirin     On MAR  . Crestor [Rosuvastatin Calcium]     On MAR Breathing issues and chest pain  . Lipitor [Atorvastatin Calcium]     On MAR Breathing issues and chest pain  . Norvasc [Amlodipine Besylate]     On MAR Caused swelling    Past Medical History  Diagnosis Date  . Diabetes mellitus   . Hypertension   . Aortic stenosis     s/p AVR August 2012  . Hypercholesterolemia   . OA (osteoarthritis)   . CAD (coronary artery disease)     s/p CABG x 15 August 2010  . Hyperlipidemia   . Degenerative arthritis of knee   . Obesity     Past Surgical History  Procedure Laterality Date  . Cardiac catheterization  07/25/2010    Dr Swaziland  . Coronary artery bypass graft  08/17/10    x1, Dr Laneta Simmers with LIMA to LAD  . Aortic valve replacement  08/17/2010    using a 21-mm Edwards pericardial Magna - Ease valve.   . Back surgery    .  Total knee arthroplasty Right 08/26/2012    Dr Eulah Pont  . Total knee arthroplasty Right 08/26/2012    Procedure: TOTAL KNEE ARTHROPLASTY;  Surgeon: Loreta Ave, MD;  Location: [facility];  Service: Orthopedics;  Laterality: Right;    History  Smoking status  . Never Smoker   Smokeless tobacco  . Never Used    History  Alcohol Use  . Yes    Comment: rarely    Family History  Problem Relation Age of Onset  . Tuberculosis Mother   . Heart disease Father     Review of Systems: The review of systems is per the HPI.  All other systems were reviewed and are negative.  Physical Exam: BP 150/80  Pulse 72  Ht 5\' 1"  (1.549 m)  Wt 183 lb 12.8 oz (83.371 kg)  BMI 34.75 kg/m2 Patient is very pleasant and in no acute distress. Skin is warm and dry. Color is normal.  HEENT is unremarkable. Normocephalic/atraumatic. PERRL. Sclera are  nonicteric. Neck is supple. No masses. No JVD. Lungs are clear. Cardiac exam shows a regular rate and rhythm. Soft outflow murmur noted. Abdomen is soft. Right knee with swelling. Incision looks ok. Gait and ROM are intact. Using a cane. No gross neurologic deficits noted.  LABORATORY DATA: PENDING  Lab Results  Component Value Date   WBC 5.0 09/06/2012   HGB 9.5* 09/06/2012   HCT 28.9* 09/06/2012   PLT 418* 09/06/2012   GLUCOSE 138* 09/19/2012   CHOL 307* 06/10/2012   TRIG 109.0 06/10/2012   HDL 70.80 06/10/2012   LDLDIRECT 212.9 06/10/2012   LDLCALC 188* 07/02/2011   ALT 11 09/19/2012   AST 17 09/19/2012   NA 139 09/19/2012   K 4.3 09/19/2012   CL 104 09/19/2012   CREATININE 1.16* 09/19/2012   BUN 29* 09/19/2012   CO2 29 09/19/2012   TSH 3.706 07/02/2011   INR 0.97 08/21/2012   HGBA1C 6.3 09/19/2012   Lab Results  Component Value Date   TROPONINI <0.30 09/07/2012    Myoview Impression  Exercise Capacity: Lexiscan with no exercise.  BP Response: Normal blood pressure response.  Clinical Symptoms: No chest pain.  ECG Impression: No significant ST segment change suggestive of ischemia.  Comparison with Prior Nuclear Study: No significant change from previous study  Overall Impression: Normal stress nuclear study.  LV Ejection Fraction: 59%. LV Wall Motion: NL LV Function; NL Wall Motion  Thomas Brackbill    Assessment / Plan: 1. CAD - past CABG x 1 with AVR back in 2012 - negative Myoview - no more chest pain - would favor medical management.   2. HLD - not on her medicines according to the pharmacy - intolerant to Lipitor and Crestor due to breathing issues and chest pain. Says she has never tried any other statin. Will recheck her labs - may try Pravachol. Further disposition to follow.   3. Recent knee surgery - seems to be progressing well.   4. AVR - last echo was in 2012   Patient is agreeable to this plan and will call if any problems develop in the interim.   Rosalio Macadamia, RN,  ANP-C [facility] [address] [place], [place]  [address]

## 2012-09-29 DIAGNOSIS — M159 Polyosteoarthritis, unspecified: Secondary | ICD-10-CM | POA: Diagnosis not present

## 2012-09-29 DIAGNOSIS — Z96659 Presence of unspecified artificial knee joint: Secondary | ICD-10-CM | POA: Diagnosis not present

## 2012-09-29 DIAGNOSIS — E119 Type 2 diabetes mellitus without complications: Secondary | ICD-10-CM | POA: Diagnosis not present

## 2012-09-29 DIAGNOSIS — I2510 Atherosclerotic heart disease of native coronary artery without angina pectoris: Secondary | ICD-10-CM | POA: Diagnosis not present

## 2012-09-29 DIAGNOSIS — I10 Essential (primary) hypertension: Secondary | ICD-10-CM | POA: Diagnosis not present

## 2012-09-29 DIAGNOSIS — Z471 Aftercare following joint replacement surgery: Secondary | ICD-10-CM | POA: Diagnosis not present

## 2012-09-30 DIAGNOSIS — Z96659 Presence of unspecified artificial knee joint: Secondary | ICD-10-CM | POA: Diagnosis not present

## 2012-09-30 DIAGNOSIS — M159 Polyosteoarthritis, unspecified: Secondary | ICD-10-CM | POA: Diagnosis not present

## 2012-09-30 DIAGNOSIS — E119 Type 2 diabetes mellitus without complications: Secondary | ICD-10-CM | POA: Diagnosis not present

## 2012-09-30 DIAGNOSIS — I10 Essential (primary) hypertension: Secondary | ICD-10-CM | POA: Diagnosis not present

## 2012-09-30 DIAGNOSIS — I2510 Atherosclerotic heart disease of native coronary artery without angina pectoris: Secondary | ICD-10-CM | POA: Diagnosis not present

## 2012-09-30 DIAGNOSIS — Z471 Aftercare following joint replacement surgery: Secondary | ICD-10-CM | POA: Diagnosis not present

## 2012-10-01 DIAGNOSIS — M159 Polyosteoarthritis, unspecified: Secondary | ICD-10-CM | POA: Diagnosis not present

## 2012-10-01 DIAGNOSIS — I10 Essential (primary) hypertension: Secondary | ICD-10-CM | POA: Diagnosis not present

## 2012-10-01 DIAGNOSIS — Z96659 Presence of unspecified artificial knee joint: Secondary | ICD-10-CM | POA: Diagnosis not present

## 2012-10-01 DIAGNOSIS — E119 Type 2 diabetes mellitus without complications: Secondary | ICD-10-CM | POA: Diagnosis not present

## 2012-10-01 DIAGNOSIS — Z471 Aftercare following joint replacement surgery: Secondary | ICD-10-CM | POA: Diagnosis not present

## 2012-10-01 DIAGNOSIS — I2510 Atherosclerotic heart disease of native coronary artery without angina pectoris: Secondary | ICD-10-CM | POA: Diagnosis not present

## 2012-10-02 DIAGNOSIS — Z96659 Presence of unspecified artificial knee joint: Secondary | ICD-10-CM | POA: Diagnosis not present

## 2012-10-02 DIAGNOSIS — E119 Type 2 diabetes mellitus without complications: Secondary | ICD-10-CM | POA: Diagnosis not present

## 2012-10-02 DIAGNOSIS — M159 Polyosteoarthritis, unspecified: Secondary | ICD-10-CM | POA: Diagnosis not present

## 2012-10-02 DIAGNOSIS — I10 Essential (primary) hypertension: Secondary | ICD-10-CM | POA: Diagnosis not present

## 2012-10-02 DIAGNOSIS — I2510 Atherosclerotic heart disease of native coronary artery without angina pectoris: Secondary | ICD-10-CM | POA: Diagnosis not present

## 2012-10-02 DIAGNOSIS — Z471 Aftercare following joint replacement surgery: Secondary | ICD-10-CM | POA: Diagnosis not present

## 2012-10-02 NOTE — Progress Notes (Signed)
Patient ID: [name], female   DOB: 1934/11/16, 78 y.o.   MRN: [id number]        HISTORY & PHYSICAL  DATE: 09/02/2012   FACILITY: [facility]  LEVEL OF CARE: SNF (31)  ALLERGIES:  Allergies  Allergen Reactions  . Aspirin     On MAR  . Crestor [Rosuvastatin Calcium]     On MAR Breathing issues and chest pain  . Lipitor [Atorvastatin Calcium]     On MAR Breathing issues and chest pain  . Norvasc [Amlodipine Besylate]     On MAR Caused swelling    CHIEF COMPLAINT:  Manage right knee osteoarthritis, diabetes mellitus, and coronary artery disease.  HISTORY OF PRESENT ILLNESS:  The patient is a 78 year-old, African-American female.    KNEE OSTEOARTHRITIS: Patient had a history of pain and functional disability in the knee due to end-stage osteoarthritis and has failed nonsurgical conservative treatments. Patient had worsening of pain with activity and weight bearing, pain that interfered with activities of daily living & pain with passive range of motion. Therefore patient underwent total knee arthroplasty and tolerated the procedure well. Patient is admitted to this facility for sort short-term rehabilitation. Patient denies knee pain.    DM:pt's DM remains stable.  Pt denies polyuria, polydipsia, polyphagia, changes in vision or hypoglycemic episodes.  No complications noted from the medication presently being used.  Last hemoglobin A1c is:   Not available.    CAD: Status post CABG.  The angina has been stable. The patient denies dyspnea on exertion, orthopnea, pedal edema, palpitations and paroxysmal nocturnal dyspnea. No complications noted from the medication presently being used.    PAST MEDICAL HISTORY :  Past Medical History  Diagnosis Date  . Diabetes mellitus   . Hypertension   . Aortic stenosis     s/p AVR August 2012  . Hypercholesterolemia   . OA (osteoarthritis)   . CAD (coronary artery disease)     s/p CABG x 15 August 2010  .  Hyperlipidemia   . Degenerative arthritis of knee   . Obesity     PAST SURGICAL HISTORY: Past Surgical History  Procedure Laterality Date  . Cardiac catheterization  07/25/2010    Dr Swaziland  . Coronary artery bypass graft  08/17/10    x1, Dr Laneta Simmers with LIMA to LAD  . Aortic valve replacement  08/17/2010    using a 21-mm Edwards pericardial Magna - Ease valve.   . Back surgery    . Total knee arthroplasty Right 08/26/2012    Dr Eulah Pont  . Total knee arthroplasty Right 08/26/2012    Procedure: TOTAL KNEE ARTHROPLASTY;  Surgeon: Loreta Ave, MD;  Location: [facility] OR;  Service: Orthopedics;  Laterality: Right;   Lumbar spine surgery.    SOCIAL HISTORY:  reports that she has never smoked. She has never used smokeless tobacco. She reports that  drinks alcohol. She reports that she does not use illicit drugs.  FAMILY HISTORY:  Family History  Problem Relation Age of Onset  . Tuberculosis Mother   . Heart disease Father    Hypertension.   Coronary artery disease.    Diabetes mellitus.    Cancer.    CURRENT MEDICATIONS: Reviewed per [facility]  REVIEW OF SYSTEMS:  See HPI otherwise 14 point ROS is negative.  PHYSICAL EXAMINATION  VS:  T 98.2       P 88      RR 18      BP 119/50  POX 95% room air        WT (Lb)  GENERAL: no acute distress, moderately obese body habitus EYES: conjunctivae normal, sclerae normal, normal eye lids MOUTH/THROAT: lips without lesions,no lesions in the mouth,tongue is without lesions,uvula elevates in midline NECK: supple, trachea midline, no neck masses, no thyroid tenderness, no thyromegaly LYMPHATICS: no LAN in the neck, no supraclavicular LAN RESPIRATORY: breathing is even & unlabored, BS CTAB CARDIAC: RRR, no murmur,no extra heart sounds EDEMA/VARICOSITIES:  +2 bilateral lower extremity edema  ARTERIAL:  pedal pulses +1   GI:  ABDOMEN: abdomen soft, normal BS, no masses, no tenderness  LIVER/SPLEEN: no hepatomegaly, no  splenomegaly MUSCULOSKELETAL: HEAD: normal to inspection & palpation BACK: no kyphosis, scoliosis or spinal processes tenderness EXTREMITIES: LEFT UPPER EXTREMITY: full range of motion, normal strength & tone RIGHT UPPER EXTREMITY:  full range of motion, normal strength & tone LEFT LOWER EXTREMITY:  full range of motion, normal strength & tone RIGHT LOWER EXTREMITY: strength intact, range of motion not tested due to surgery   PSYCHIATRIC: the patient is alert & oriented to person, affect & behavior appropriate  LABS/RADIOLOGY: Chest x-ray:  No acute disease.    Right knee x-ray:  No acute problems.     ASSESSMENT/PLAN:  Right knee osteoarthritis.  Status post right total knee arthroplasty.    Diabetes mellitus.  Continue current medications.    CAD.  Stable.    Hypertension.  Stable.    Hyperlipidemia.  Continue Zetia and fenofibrate.    Check CBC and BMP.    I have reviewed patient's medical records received at admission/from hospitalization.  CPT CODE: 45409

## 2012-10-05 DIAGNOSIS — I2510 Atherosclerotic heart disease of native coronary artery without angina pectoris: Secondary | ICD-10-CM | POA: Diagnosis not present

## 2012-10-05 DIAGNOSIS — Z96659 Presence of unspecified artificial knee joint: Secondary | ICD-10-CM | POA: Diagnosis not present

## 2012-10-05 DIAGNOSIS — Z471 Aftercare following joint replacement surgery: Secondary | ICD-10-CM | POA: Diagnosis not present

## 2012-10-05 DIAGNOSIS — I10 Essential (primary) hypertension: Secondary | ICD-10-CM | POA: Diagnosis not present

## 2012-10-05 DIAGNOSIS — E119 Type 2 diabetes mellitus without complications: Secondary | ICD-10-CM | POA: Diagnosis not present

## 2012-10-05 DIAGNOSIS — M159 Polyosteoarthritis, unspecified: Secondary | ICD-10-CM | POA: Diagnosis not present

## 2012-10-06 ENCOUNTER — Telehealth: Payer: Self-pay | Admitting: Cardiology

## 2012-10-06 ENCOUNTER — Encounter: Payer: Self-pay | Admitting: *Deleted

## 2012-10-06 DIAGNOSIS — I2510 Atherosclerotic heart disease of native coronary artery without angina pectoris: Secondary | ICD-10-CM | POA: Diagnosis not present

## 2012-10-06 DIAGNOSIS — I10 Essential (primary) hypertension: Secondary | ICD-10-CM | POA: Diagnosis not present

## 2012-10-06 DIAGNOSIS — Z96659 Presence of unspecified artificial knee joint: Secondary | ICD-10-CM | POA: Diagnosis not present

## 2012-10-06 DIAGNOSIS — M159 Polyosteoarthritis, unspecified: Secondary | ICD-10-CM | POA: Diagnosis not present

## 2012-10-06 DIAGNOSIS — E119 Type 2 diabetes mellitus without complications: Secondary | ICD-10-CM | POA: Diagnosis not present

## 2012-10-06 DIAGNOSIS — Z471 Aftercare following joint replacement surgery: Secondary | ICD-10-CM | POA: Diagnosis not present

## 2012-10-06 NOTE — Telephone Encounter (Signed)
Left message for patient's sister to call back.

## 2012-10-06 NOTE — Telephone Encounter (Signed)
New Problem  Pt recently had a stress and request results.

## 2012-10-07 DIAGNOSIS — I10 Essential (primary) hypertension: Secondary | ICD-10-CM | POA: Diagnosis not present

## 2012-10-07 DIAGNOSIS — E119 Type 2 diabetes mellitus without complications: Secondary | ICD-10-CM | POA: Diagnosis not present

## 2012-10-07 DIAGNOSIS — M159 Polyosteoarthritis, unspecified: Secondary | ICD-10-CM | POA: Diagnosis not present

## 2012-10-07 DIAGNOSIS — I2510 Atherosclerotic heart disease of native coronary artery without angina pectoris: Secondary | ICD-10-CM | POA: Diagnosis not present

## 2012-10-07 DIAGNOSIS — Z471 Aftercare following joint replacement surgery: Secondary | ICD-10-CM | POA: Diagnosis not present

## 2012-10-07 DIAGNOSIS — Z96659 Presence of unspecified artificial knee joint: Secondary | ICD-10-CM | POA: Diagnosis not present

## 2012-10-07 NOTE — Telephone Encounter (Signed)
Returned call to patient's sister Shirley no answer.LMTC. 

## 2012-10-09 DIAGNOSIS — E119 Type 2 diabetes mellitus without complications: Secondary | ICD-10-CM | POA: Diagnosis not present

## 2012-10-09 DIAGNOSIS — I2510 Atherosclerotic heart disease of native coronary artery without angina pectoris: Secondary | ICD-10-CM | POA: Diagnosis not present

## 2012-10-09 DIAGNOSIS — Z96659 Presence of unspecified artificial knee joint: Secondary | ICD-10-CM | POA: Diagnosis not present

## 2012-10-09 DIAGNOSIS — M159 Polyosteoarthritis, unspecified: Secondary | ICD-10-CM | POA: Diagnosis not present

## 2012-10-09 DIAGNOSIS — Z471 Aftercare following joint replacement surgery: Secondary | ICD-10-CM | POA: Diagnosis not present

## 2012-10-09 DIAGNOSIS — I10 Essential (primary) hypertension: Secondary | ICD-10-CM | POA: Diagnosis not present

## 2012-10-12 DIAGNOSIS — E119 Type 2 diabetes mellitus without complications: Secondary | ICD-10-CM | POA: Diagnosis not present

## 2012-10-12 DIAGNOSIS — I10 Essential (primary) hypertension: Secondary | ICD-10-CM | POA: Diagnosis not present

## 2012-10-12 DIAGNOSIS — M159 Polyosteoarthritis, unspecified: Secondary | ICD-10-CM | POA: Diagnosis not present

## 2012-10-12 DIAGNOSIS — I2510 Atherosclerotic heart disease of native coronary artery without angina pectoris: Secondary | ICD-10-CM | POA: Diagnosis not present

## 2012-10-12 DIAGNOSIS — Z471 Aftercare following joint replacement surgery: Secondary | ICD-10-CM | POA: Diagnosis not present

## 2012-10-12 DIAGNOSIS — Z96659 Presence of unspecified artificial knee joint: Secondary | ICD-10-CM | POA: Diagnosis not present

## 2012-10-12 DIAGNOSIS — E1151 Type 2 diabetes mellitus with diabetic peripheral angiopathy without gangrene: Secondary | ICD-10-CM | POA: Insufficient documentation

## 2012-10-12 NOTE — Progress Notes (Signed)
Patient ID: [name] [name], female   DOB: 1934-02-16, 78 y.o.   MRN: [id number]        HISTORY & PHYSICAL  DATE: 09/09/2012   FACILITY: [facility]  LEVEL OF CARE: SNF (31)  ALLERGIES:  Allergies  Allergen Reactions  . Aspirin     On MAR  . Crestor [Rosuvastatin Calcium]     On MAR Breathing issues and chest pain  . Lipitor [Atorvastatin Calcium]     On MAR Breathing issues and chest pain  . Norvasc [Amlodipine Besylate]     On MAR Caused swelling    CHIEF COMPLAINT:  Manage coronary artery disease, hypertension, and diabetes mellitus.    HISTORY OF PRESENT ILLNESS:  The patient is a 78 year-old, African-American female.    CAD: The patient was hospitalized secondary to intermittent pain.  EKG was negative for acute cardiac event and cardiac markers were normal.   The angina has been stable. The patient denies dyspnea on exertion, orthopnea, pedal edema, palpitations and paroxysmal nocturnal dyspnea. No complications noted from the medication presently being used.    HTN: Pt 's HTN remains stable.  Denies CP, sob, DOE, pedal edema, headaches, dizziness or visual disturbances.  No complications from the medications currently being used.  Last BP :  101/39.    DM:pt's DM remains stable.  Pt denies polyuria, polydipsia, polyphagia, changes in vision or hypoglycemic episodes.  No complications noted from the medication presently being used.  Last hemoglobin A1c is:  Not available.    PAST MEDICAL HISTORY :  Past Medical History  Diagnosis Date  . Diabetes mellitus   . Hypertension   . Aortic stenosis     s/p AVR August 2012  . Hypercholesterolemia   . OA (osteoarthritis)   . CAD (coronary artery disease)     s/p CABG x 15 August 2010  . Hyperlipidemia   . Degenerative arthritis of knee   . Obesity     PAST SURGICAL HISTORY: Past Surgical History  Procedure Laterality Date  . Cardiac catheterization  07/25/2010    Dr Sw[place]and  . Coronary artery  bypass graft  08/17/10    x1, Dr Laneta Simmers with LIMA to LAD  . Aortic valve replacement  08/17/2010    using a 21-mm Edwards pericardial Magna - Ease valve.   . Back surgery    . Total knee arthroplasty Right 08/26/2012    Dr Eulah Pont  . Total knee arthroplasty Right 08/26/2012    Procedure: TOTAL KNEE ARTHROPLASTY;  Surgeon: Loreta Ave, MD;  Location: [facility] OR;  Service: Orthopedics;  Laterality: Right;    SOCIAL HISTORY:  reports that she has never smoked. She has never used smokeless tobacco. She reports that  drinks alcohol. She reports that she does not use illicit drugs.  FAMILY HISTORY:  Family History  Problem Relation Age of Onset  . Tuberculosis Mother   . Heart disease Father     CURRENT MEDICATIONS: Reviewed per [facility]  REVIEW OF SYSTEMS:  See HPI otherwise 14 point ROS is negative.  PHYSICAL EXAMINATION  VS:  T 98.1       P 75      RR 16      BP 101/39      POX 100%        WT (Lb) 180  GENERAL: no acute distress,morbidly obese body habitus EYES: conjunctivae normal, sclerae normal, normal eye lids MOUTH/THROAT: lips without lesions,no lesions in the mouth,tongue is without lesions,uvula elevates  in midline NECK: supple, trachea midline, no neck masses, no thyroid tenderness, no thyromegaly LYMPHATICS: no LAN in the neck, no supraclavicular LAN RESPIRATORY: breathing is even & unlabored, BS CTAB CARDIAC: RRR, no murmur,no extra heart sounds, no edema GI:  ABDOMEN: abdomen soft, normal BS, no masses, no tenderness  LIVER/SPLEEN: no hepatomegaly, no splenomegaly MUSCULOSKELETAL: HEAD: normal to inspection & palpation BACK: no kyphosis, scoliosis or spinal processes tenderness EXTREMITIES: LEFT UPPER EXTREMITY: full range of motion, normal strength & tone RIGHT UPPER EXTREMITY:  full range of motion, normal strength & tone LEFT LOWER EXTREMITY: strength intact, range of motion moderate   RIGHT LOWER EXTREMITY: strength intact, range of motion not tested due to surgery    PSYCHIATRIC: the patient is alert & oriented to person, affect & behavior appropriate  LABS/RADIOLOGY: Hemoglobin 9.5, MCV 80.7, platelets 418, WBC 5.    Glucose 134, BUN 34, otherwise BMP normal.    Troponin-I less than 0.03 x3.    ASSESSMENT/PLAN:  CAD.  Stable.     Hypertension.  Well controlled.    Diabetes mellitus with vascular complications.  Continue current medications.    Right knee osteoarthritis.  Status post right total knee arthroplasty.  Continue rehabilitation.    Acute blood loss anemia.  Reassess.    Hyperlipidemia.  Continue current medications.    Check CBC and BMP.     I have reviewed patient's medical records received at admission/from hospitalization.  CPT CODE: 96045

## 2012-10-13 DIAGNOSIS — I2510 Atherosclerotic heart disease of native coronary artery without angina pectoris: Secondary | ICD-10-CM | POA: Diagnosis not present

## 2012-10-13 DIAGNOSIS — Z96659 Presence of unspecified artificial knee joint: Secondary | ICD-10-CM | POA: Diagnosis not present

## 2012-10-13 DIAGNOSIS — M159 Polyosteoarthritis, unspecified: Secondary | ICD-10-CM | POA: Diagnosis not present

## 2012-10-13 DIAGNOSIS — Z471 Aftercare following joint replacement surgery: Secondary | ICD-10-CM | POA: Diagnosis not present

## 2012-10-13 DIAGNOSIS — I10 Essential (primary) hypertension: Secondary | ICD-10-CM | POA: Diagnosis not present

## 2012-10-13 DIAGNOSIS — E119 Type 2 diabetes mellitus without complications: Secondary | ICD-10-CM | POA: Diagnosis not present

## 2012-10-14 DIAGNOSIS — Z471 Aftercare following joint replacement surgery: Secondary | ICD-10-CM | POA: Diagnosis not present

## 2012-10-14 DIAGNOSIS — I2510 Atherosclerotic heart disease of native coronary artery without angina pectoris: Secondary | ICD-10-CM | POA: Diagnosis not present

## 2012-10-14 DIAGNOSIS — I10 Essential (primary) hypertension: Secondary | ICD-10-CM | POA: Diagnosis not present

## 2012-10-14 DIAGNOSIS — Z96659 Presence of unspecified artificial knee joint: Secondary | ICD-10-CM | POA: Diagnosis not present

## 2012-10-14 DIAGNOSIS — E119 Type 2 diabetes mellitus without complications: Secondary | ICD-10-CM | POA: Diagnosis not present

## 2012-10-14 DIAGNOSIS — M159 Polyosteoarthritis, unspecified: Secondary | ICD-10-CM | POA: Diagnosis not present

## 2012-10-15 DIAGNOSIS — E119 Type 2 diabetes mellitus without complications: Secondary | ICD-10-CM | POA: Diagnosis not present

## 2012-10-15 DIAGNOSIS — M159 Polyosteoarthritis, unspecified: Secondary | ICD-10-CM | POA: Diagnosis not present

## 2012-10-15 DIAGNOSIS — I10 Essential (primary) hypertension: Secondary | ICD-10-CM | POA: Diagnosis not present

## 2012-10-15 DIAGNOSIS — Z471 Aftercare following joint replacement surgery: Secondary | ICD-10-CM | POA: Diagnosis not present

## 2012-10-15 DIAGNOSIS — I2510 Atherosclerotic heart disease of native coronary artery without angina pectoris: Secondary | ICD-10-CM | POA: Diagnosis not present

## 2012-10-15 DIAGNOSIS — Z96659 Presence of unspecified artificial knee joint: Secondary | ICD-10-CM | POA: Diagnosis not present

## 2012-10-19 DIAGNOSIS — Z96659 Presence of unspecified artificial knee joint: Secondary | ICD-10-CM | POA: Diagnosis not present

## 2012-10-19 DIAGNOSIS — E119 Type 2 diabetes mellitus without complications: Secondary | ICD-10-CM | POA: Diagnosis not present

## 2012-10-19 DIAGNOSIS — I2510 Atherosclerotic heart disease of native coronary artery without angina pectoris: Secondary | ICD-10-CM | POA: Diagnosis not present

## 2012-10-19 DIAGNOSIS — Z471 Aftercare following joint replacement surgery: Secondary | ICD-10-CM | POA: Diagnosis not present

## 2012-10-19 DIAGNOSIS — M159 Polyosteoarthritis, unspecified: Secondary | ICD-10-CM | POA: Diagnosis not present

## 2012-10-19 DIAGNOSIS — I10 Essential (primary) hypertension: Secondary | ICD-10-CM | POA: Diagnosis not present

## 2012-10-20 ENCOUNTER — Other Ambulatory Visit: Payer: Self-pay | Admitting: *Deleted

## 2012-10-20 DIAGNOSIS — Z471 Aftercare following joint replacement surgery: Secondary | ICD-10-CM | POA: Diagnosis not present

## 2012-10-20 DIAGNOSIS — E119 Type 2 diabetes mellitus without complications: Secondary | ICD-10-CM | POA: Diagnosis not present

## 2012-10-20 DIAGNOSIS — M159 Polyosteoarthritis, unspecified: Secondary | ICD-10-CM | POA: Diagnosis not present

## 2012-10-20 DIAGNOSIS — I10 Essential (primary) hypertension: Secondary | ICD-10-CM | POA: Diagnosis not present

## 2012-10-20 DIAGNOSIS — I2510 Atherosclerotic heart disease of native coronary artery without angina pectoris: Secondary | ICD-10-CM | POA: Diagnosis not present

## 2012-10-20 DIAGNOSIS — Z96659 Presence of unspecified artificial knee joint: Secondary | ICD-10-CM | POA: Diagnosis not present

## 2012-10-20 MED ORDER — PRAVASTATIN SODIUM 20 MG PO TABS: 20.0000 mg | ORAL_TABLET | Freq: Every evening | ORAL | Status: DC

## 2012-10-20 NOTE — Telephone Encounter (Signed)
Spoke with pt's daughter Bonita Quin) and reviewed stress test results with her.

## 2012-10-21 DIAGNOSIS — I2510 Atherosclerotic heart disease of native coronary artery without angina pectoris: Secondary | ICD-10-CM | POA: Diagnosis not present

## 2012-10-21 DIAGNOSIS — M159 Polyosteoarthritis, unspecified: Secondary | ICD-10-CM | POA: Diagnosis not present

## 2012-10-21 DIAGNOSIS — I10 Essential (primary) hypertension: Secondary | ICD-10-CM | POA: Diagnosis not present

## 2012-10-21 DIAGNOSIS — E119 Type 2 diabetes mellitus without complications: Secondary | ICD-10-CM | POA: Diagnosis not present

## 2012-10-21 DIAGNOSIS — Z96659 Presence of unspecified artificial knee joint: Secondary | ICD-10-CM | POA: Diagnosis not present

## 2012-10-21 DIAGNOSIS — Z471 Aftercare following joint replacement surgery: Secondary | ICD-10-CM | POA: Diagnosis not present

## 2012-10-24 ENCOUNTER — Other Ambulatory Visit: Payer: Self-pay | Admitting: Internal Medicine

## 2012-10-30 DIAGNOSIS — Z471 Aftercare following joint replacement surgery: Secondary | ICD-10-CM | POA: Diagnosis not present

## 2012-10-30 DIAGNOSIS — I2510 Atherosclerotic heart disease of native coronary artery without angina pectoris: Secondary | ICD-10-CM | POA: Diagnosis not present

## 2012-10-30 DIAGNOSIS — IMO0001 Reserved for inherently not codable concepts without codable children: Secondary | ICD-10-CM | POA: Diagnosis not present

## 2012-10-30 DIAGNOSIS — I10 Essential (primary) hypertension: Secondary | ICD-10-CM | POA: Diagnosis not present

## 2012-10-30 DIAGNOSIS — E119 Type 2 diabetes mellitus without complications: Secondary | ICD-10-CM | POA: Diagnosis not present

## 2012-10-30 DIAGNOSIS — R269 Unspecified abnormalities of gait and mobility: Secondary | ICD-10-CM | POA: Diagnosis not present

## 2012-11-03 DIAGNOSIS — Z471 Aftercare following joint replacement surgery: Secondary | ICD-10-CM | POA: Diagnosis not present

## 2012-11-03 DIAGNOSIS — I2510 Atherosclerotic heart disease of native coronary artery without angina pectoris: Secondary | ICD-10-CM | POA: Diagnosis not present

## 2012-11-03 DIAGNOSIS — I10 Essential (primary) hypertension: Secondary | ICD-10-CM | POA: Diagnosis not present

## 2012-11-03 DIAGNOSIS — R269 Unspecified abnormalities of gait and mobility: Secondary | ICD-10-CM | POA: Diagnosis not present

## 2012-11-03 DIAGNOSIS — IMO0001 Reserved for inherently not codable concepts without codable children: Secondary | ICD-10-CM | POA: Diagnosis not present

## 2012-11-03 DIAGNOSIS — E119 Type 2 diabetes mellitus without complications: Secondary | ICD-10-CM | POA: Diagnosis not present

## 2012-11-04 DIAGNOSIS — Z471 Aftercare following joint replacement surgery: Secondary | ICD-10-CM | POA: Diagnosis not present

## 2012-11-04 DIAGNOSIS — I2510 Atherosclerotic heart disease of native coronary artery without angina pectoris: Secondary | ICD-10-CM | POA: Diagnosis not present

## 2012-11-04 DIAGNOSIS — I10 Essential (primary) hypertension: Secondary | ICD-10-CM | POA: Diagnosis not present

## 2012-11-04 DIAGNOSIS — IMO0001 Reserved for inherently not codable concepts without codable children: Secondary | ICD-10-CM | POA: Diagnosis not present

## 2012-11-04 DIAGNOSIS — R269 Unspecified abnormalities of gait and mobility: Secondary | ICD-10-CM | POA: Diagnosis not present

## 2012-11-04 DIAGNOSIS — E119 Type 2 diabetes mellitus without complications: Secondary | ICD-10-CM | POA: Diagnosis not present

## 2012-11-20 DIAGNOSIS — Z471 Aftercare following joint replacement surgery: Secondary | ICD-10-CM | POA: Diagnosis not present

## 2012-11-20 DIAGNOSIS — Z96659 Presence of unspecified artificial knee joint: Secondary | ICD-10-CM | POA: Diagnosis not present

## 2012-11-30 ENCOUNTER — Other Ambulatory Visit: Payer: Medicare Other

## 2012-12-15 ENCOUNTER — Other Ambulatory Visit: Payer: Self-pay | Admitting: Internal Medicine

## 2013-02-03 DIAGNOSIS — M79609 Pain in unspecified limb: Secondary | ICD-10-CM | POA: Diagnosis not present

## 2013-02-03 DIAGNOSIS — L600 Ingrowing nail: Secondary | ICD-10-CM | POA: Diagnosis not present

## 2013-03-12 NOTE — Progress Notes (Signed)
This encounter was created in error - please disregard.

## 2013-09-24 ENCOUNTER — Emergency Department (HOSPITAL_COMMUNITY): Payer: Medicare Other

## 2013-09-24 ENCOUNTER — Telehealth: Payer: Self-pay | Admitting: Cardiology

## 2013-09-24 ENCOUNTER — Inpatient Hospital Stay (HOSPITAL_COMMUNITY)
Admission: EM | Admit: 2013-09-24 | Discharge: 2013-09-25 | DRG: 303 | Disposition: A | Discharge status: Home / Self Care | Payer: Medicare Other | Attending: Cardiology | Admitting: Cardiology

## 2013-09-24 ENCOUNTER — Encounter (HOSPITAL_COMMUNITY): Payer: Self-pay | Admitting: Emergency Medicine

## 2013-09-24 DIAGNOSIS — I10 Essential (primary) hypertension: Secondary | ICD-10-CM

## 2013-09-24 DIAGNOSIS — Z951 Presence of aortocoronary bypass graft: Secondary | ICD-10-CM | POA: Diagnosis not present

## 2013-09-24 DIAGNOSIS — I209 Angina pectoris, unspecified: Secondary | ICD-10-CM | POA: Diagnosis not present

## 2013-09-24 DIAGNOSIS — E119 Type 2 diabetes mellitus without complications: Secondary | ICD-10-CM | POA: Diagnosis not present

## 2013-09-24 DIAGNOSIS — I359 Nonrheumatic aortic valve disorder, unspecified: Secondary | ICD-10-CM

## 2013-09-24 DIAGNOSIS — Z7902 Long term (current) use of antithrombotics/antiplatelets: Secondary | ICD-10-CM

## 2013-09-24 DIAGNOSIS — Z96659 Presence of unspecified artificial knee joint: Secondary | ICD-10-CM

## 2013-09-24 DIAGNOSIS — E7800 Pure hypercholesterolemia, unspecified: Secondary | ICD-10-CM

## 2013-09-24 DIAGNOSIS — Z8249 Family history of ischemic heart disease and other diseases of the circulatory system: Secondary | ICD-10-CM

## 2013-09-24 DIAGNOSIS — E669 Obesity, unspecified: Secondary | ICD-10-CM | POA: Diagnosis present

## 2013-09-24 DIAGNOSIS — Z79899 Other long term (current) drug therapy: Secondary | ICD-10-CM

## 2013-09-24 DIAGNOSIS — R079 Chest pain, unspecified: Secondary | ICD-10-CM

## 2013-09-24 DIAGNOSIS — I2510 Atherosclerotic heart disease of native coronary artery without angina pectoris: Secondary | ICD-10-CM | POA: Diagnosis present

## 2013-09-24 DIAGNOSIS — E785 Hyperlipidemia, unspecified: Secondary | ICD-10-CM | POA: Diagnosis present

## 2013-09-24 DIAGNOSIS — E1059 Type 1 diabetes mellitus with other circulatory complications: Secondary | ICD-10-CM

## 2013-09-24 DIAGNOSIS — Z6833 Body mass index (BMI) 33.0-33.9, adult: Secondary | ICD-10-CM | POA: Diagnosis not present

## 2013-09-24 DIAGNOSIS — I350 Nonrheumatic aortic (valve) stenosis: Secondary | ICD-10-CM

## 2013-09-24 DIAGNOSIS — D649 Anemia, unspecified: Secondary | ICD-10-CM | POA: Diagnosis present

## 2013-09-24 DIAGNOSIS — M1710 Unilateral primary osteoarthritis, unspecified knee: Secondary | ICD-10-CM | POA: Diagnosis present

## 2013-09-24 DIAGNOSIS — Z7189 Other specified counseling: Secondary | ICD-10-CM

## 2013-09-24 DIAGNOSIS — I200 Unstable angina: Secondary | ICD-10-CM | POA: Diagnosis not present

## 2013-09-24 DIAGNOSIS — E789 Disorder of lipoprotein metabolism, unspecified: Secondary | ICD-10-CM

## 2013-09-24 DIAGNOSIS — M170 Bilateral primary osteoarthritis of knee: Secondary | ICD-10-CM

## 2013-09-24 DIAGNOSIS — Z952 Presence of prosthetic heart valve: Secondary | ICD-10-CM | POA: Diagnosis not present

## 2013-09-24 DIAGNOSIS — Z791 Long term (current) use of non-steroidal anti-inflammatories (NSAID): Secondary | ICD-10-CM | POA: Diagnosis not present

## 2013-09-24 DIAGNOSIS — M25569 Pain in unspecified knee: Secondary | ICD-10-CM

## 2013-09-24 DIAGNOSIS — I5189 Other ill-defined heart diseases: Secondary | ICD-10-CM | POA: Diagnosis not present

## 2013-09-24 LAB — BASIC METABOLIC PANEL
Anion gap: 13 (ref 5–15)
BUN: 14 mg/dL (ref 6–23)
CO2: 26 mEq/L (ref 19–32)
Calcium: 9 mg/dL (ref 8.4–10.5)
Chloride: 99 mEq/L (ref 96–112)
Creatinine, Ser: 0.83 mg/dL (ref 0.50–1.10)
GFR calc Af Amer: 77 mL/min — ABNORMAL LOW (ref >90)
GFR, EST NON AFRICAN AMERICAN: 67 mL/min — AB (ref >90)
GLUCOSE: 140 mg/dL — AB (ref 70–99)
POTASSIUM: 4.8 meq/L (ref 3.7–5.3)
SODIUM: 138 meq/L (ref 137–147)

## 2013-09-24 LAB — CBC
HEMATOCRIT: 35.2 % — AB (ref 36.0–46.0)
HEMOGLOBIN: 11.1 g/dL — AB (ref 12.0–15.0)
MCH: 24.8 pg — ABNORMAL LOW (ref 26.0–34.0)
MCHC: 31.5 g/dL (ref 30.0–36.0)
MCV: 78.6 fL (ref 78.0–100.0)
Platelets: 244 10*3/uL (ref 150–400)
RBC: 4.48 MIL/uL (ref 3.87–5.11)
RDW: 16.7 % — ABNORMAL HIGH (ref 11.5–15.5)
WBC: 7.8 10*3/uL (ref 4.0–10.5)

## 2013-09-24 LAB — TROPONIN I: Troponin I: <0.3 ng/mL (ref <0.30)

## 2013-09-24 LAB — PROTIME-INR
INR: 1.15 (ref 0.00–1.49)
Prothrombin Time: 14.7 seconds (ref 11.6–15.2)

## 2013-09-24 LAB — GLUCOSE, CAPILLARY: Glucose-Capillary: 206 mg/dL — ABNORMAL HIGH (ref 70–99)

## 2013-09-24 LAB — TSH: TSH: 2.81 u[IU]/mL (ref 0.350–4.500)

## 2013-09-24 LAB — MAGNESIUM: Magnesium: 1.9 mg/dL (ref 1.5–2.5)

## 2013-09-24 MED ORDER — INSULIN ASPART 100 UNIT/ML ~~LOC~~ SOLN
0.0000 [IU] | Freq: Three times a day (TID) | SUBCUTANEOUS | Status: DC
  Administered 2013-09-25: 2 [IU] via SUBCUTANEOUS

## 2013-09-24 MED ORDER — HEPARIN BOLUS VIA INFUSION
3500.0000 [IU] | Freq: Once | INTRAVENOUS | Status: AC
Start: 2013-09-24 — End: 2013-09-24
  Administered 2013-09-24: 3500 [IU] via INTRAVENOUS
  Filled 2013-09-24: qty 3500

## 2013-09-24 MED ORDER — SIMVASTATIN 10 MG PO TABS
10.0000 mg | ORAL_TABLET | Freq: Every day | ORAL | Status: DC
  Administered 2013-09-25: 10 mg via ORAL
  Filled 2013-09-24: qty 1

## 2013-09-24 MED ORDER — NITROGLYCERIN 0.4 MG SL SUBL: 0.4000 mg | SUBLINGUAL_TABLET | SUBLINGUAL | Status: DC | PRN

## 2013-09-24 MED ORDER — ACETAMINOPHEN 325 MG PO TABS: 650.0000 mg | ORAL_TABLET | ORAL | Status: DC | PRN

## 2013-09-24 MED ORDER — METOPROLOL TARTRATE 25 MG PO TABS
25.0000 mg | ORAL_TABLET | Freq: Two times a day (BID) | ORAL | Status: DC
  Administered 2013-09-24 – 2013-09-25 (×2): 25 mg via ORAL
  Filled 2013-09-24 (×3): qty 1

## 2013-09-24 MED ORDER — HYDROCHLOROTHIAZIDE 25 MG PO TABS
25.0000 mg | ORAL_TABLET | Freq: Every day | ORAL | Status: DC
Start: 2013-09-25 — End: 2013-09-25
  Administered 2013-09-25: 25 mg via ORAL
  Filled 2013-09-24: qty 1

## 2013-09-24 MED ORDER — IRBESARTAN 300 MG PO TABS
300.0000 mg | ORAL_TABLET | Freq: Every day | ORAL | Status: DC
  Administered 2013-09-25: 300 mg via ORAL
  Filled 2013-09-24: qty 1

## 2013-09-24 MED ORDER — HEPARIN (PORCINE) IN NACL 100-0.45 UNIT/ML-% IJ SOLN
950.0000 [IU]/h | INTRAMUSCULAR | Status: DC
  Administered 2013-09-24: 750 [IU]/h via INTRAVENOUS
  Filled 2013-09-24 (×2): qty 250

## 2013-09-24 MED ORDER — SODIUM CHLORIDE 0.9 % IV SOLN
INTRAVENOUS | Status: DC
  Administered 2013-09-24 – 2013-09-25 (×2): via INTRAVENOUS

## 2013-09-24 MED ORDER — VALSARTAN-HYDROCHLOROTHIAZIDE 320-25 MG PO TABS: 1.0000 | ORAL_TABLET | Freq: Every day | ORAL | Status: DC

## 2013-09-24 MED ORDER — ONDANSETRON HCL 4 MG/2ML IJ SOLN
4.0000 mg | Freq: Four times a day (QID) | INTRAMUSCULAR | Status: DC | PRN
Start: 2013-09-24 — End: 2013-09-25

## 2013-09-24 MED ORDER — CLOPIDOGREL BISULFATE 75 MG PO TABS
75.0000 mg | ORAL_TABLET | Freq: Every day | ORAL | Status: DC
  Administered 2013-09-25: 75 mg via ORAL
  Filled 2013-09-24: qty 1

## 2013-09-24 NOTE — ED Notes (Signed)
The pt is c/o mid-chest pain since yesterday.  No sob no nv no dizziness.  cabg 2 years ago.  Nothing makes the pain better or worse

## 2013-09-24 NOTE — H&P (Addendum)
Admit date: 09/24/2013 Referring Physician Dr. Radford Pax Primary Cardiologist Dr. Swaziland Chief complaint/reason for admission: Chest pain  HPI: This is a 79yo AAF with a history of DM, HTN, AS s/p pericardial AVR 8/12, dyslipidemia, CAD s/p CABG 8/12 with LIMA to LAD who was in her USOH until around 10am this morning when she developed onset of 3-4/10 chest pain in the midsternal region without radiation.  The pain has been off and on all day.  She denies any SOB, DOE, diaphoresis or nausea.  Currently she is pain free. Initial cardiac enzymes are normal and Cardiology is now asked to see for admission for r/o MI.      PMH:    Past Medical History  Diagnosis Date  . Diabetes mellitus   . Hypertension   . Aortic stenosis     s/p AVR August 2012  . Hypercholesterolemia   . OA (osteoarthritis)   . CAD (coronary artery disease)     s/p CABG x 15 August 2010  . Hyperlipidemia   . Degenerative arthritis of knee   . Obesity     PSH:    Past Surgical History  Procedure Laterality Date  . Cardiac catheterization  07/25/2010    Dr Swaziland  . Coronary artery bypass graft  08/17/10    x1, Dr Laneta Simmers with LIMA to LAD  . Aortic valve replacement  08/17/2010    using a 21-mm Edwards pericardial Magna - Ease valve.   . Back surgery    . Total knee arthroplasty Right 08/26/2012    Dr Eulah Pont  . Total knee arthroplasty Right 08/26/2012    Procedure: TOTAL KNEE ARTHROPLASTY;  Surgeon: Loreta Ave, MD;  Location: [facility] OR;  Service: Orthopedics;  Laterality: Right;    ALLERGIES:   Crestor; Lipitor; Norvasc; and Aspirin  Prior to Admit Meds:   (Not in a hospital admission) Family HX:    Family History  Problem Relation Age of Onset  . Tuberculosis Mother   . Heart disease Father    Social HX:    History   Social History  . Marital Status: Single    Spouse Name: N/A    Number of Children: 3  . Years of Education: N/A   Occupational History  . Not on file.   Social History Main Topics    . Smoking status: Never Smoker   . Smokeless tobacco: Never Used  . Alcohol Use: Yes     Comment: rarely  . Drug Use: No  . Sexual Activity: No   Other Topics Concern  . Not on file   Social History Narrative  . No narrative on file     ROS:  All 11 ROS were addressed and are negative except what is stated in the HPI  PHYSICAL EXAM Filed Vitals:   09/24/13 2025  BP:   Pulse:   Temp: 98.5 F (36.9 C)  Resp:    General: Well developed, well nourished, in no acute distress Head: Eyes PERRLA, No xanthomas.   Normal cephalic and atramatic  Lungs:   Clear bilaterally to auscultation and percussion. Heart:   HRRR S1 S2 Pulses are 2+ & equal.            No carotid bruit. No JVD.  No abdominal bruits. No femoral bruits. Abdomen: Bowel sounds are positive, abdomen soft and non-tender without masses  Extremities:   No clubbing, cyanosis or edema.  DP +1 Neuro: Alert and oriented X 3. Psych:  Good affect, responds appropriately  Labs:   Lab Results  Component Value Date   WBC 7.8 09/24/2013   HGB 11.1* 09/24/2013   HCT 35.2* 09/24/2013   MCV 78.6 09/24/2013   PLT 244 09/24/2013    Recent Labs Lab 09/24/13 1507  NA 138  K 4.8  CL 99  CO2 26  BUN 14  CREATININE 0.83  CALCIUM 9.0  GLUCOSE 140*   Lab Results  Component Value Date   TROPONINI <0.30 09/24/2013   No results found for this basename: PTT   Lab Results  Component Value Date   INR 0.97 08/21/2012   INR 1.49 08/17/2010   INR 1.00 08/15/2010     Lab Results  Component Value Date   CHOL 263* 09/28/2012   CHOL 307* 06/10/2012   CHOL 305* 07/02/2011   Lab Results  Component Value Date   HDL 57.00 09/28/2012   HDL 16.10 06/10/2012   HDL 74 07/02/2011   Lab Results  Component Value Date   LDLCALC 188* 07/02/2011   Lab Results  Component Value Date   TRIG 183.0* 09/28/2012   TRIG 109.0 06/10/2012   TRIG 213* 07/02/2011   Lab Results  Component Value Date   CHOLHDL 5 09/28/2012   CHOLHDL 4 06/10/2012    CHOLHDL 4.1 07/02/2011   Lab Results  Component Value Date   LDLDIRECT 169.9 09/28/2012   LDLDIRECT 212.9 06/10/2012      Radiology:  No results found.  EKG:  NSR with T wave abnormality in the inferolateral leads - since EKG 2014 the T wave inversion in the lateral precordial leads are old but the inferior T wave inversions are new but subtle  ASSESSMENT:  1.  Chest pain syndrome of unclear etiology.  It has been on and off all day and then became constant and patient presented to ER.  Her initial troponin is normal.  Her EKG has new subtle T wave inversions in the inferior leads but the T wave inversions in the lateral precordial leads are old.  She currently is pain free.  She had a stress myoview 09/2012 that showed no ischemia. 2.  ASCAD s/p LIMA to LAD 2012 3.  Severe AS s/p pericardial AVR 2012 4.  HTN 5.  Dylipidemia - intolerant to Lipitor and Crestor due to breathing issues and CP  Now on Pravachol   PLAN:   1.  Admit to tele bed 2.  Cycle cardiac enzymes 3.  IV Heparin gtt until rule out complete 4.  Continue ASA/statin/Plavix/ARB and BB 5.  NPO after MN 6.  Lexiscan myoview in am if she rules out for MI 7.  Check FLP in am  Quintella Reichert, MD  09/24/2013  8:42 PM

## 2013-09-24 NOTE — Telephone Encounter (Signed)
New Prob   Pt states she is experiencing active, constant CP onset 10 AM. Pt has not taken any medications since noting symptoms.

## 2013-09-24 NOTE — ED Provider Notes (Signed)
CSN: [id number]     Arrival date & time 09/24/13  1455 History   First MD Initiated Contact with Patient 09/24/13 1544     Chief Complaint  Patient presents with  . Chest Pain      HPI Patient comes in with midsternal chest pain suggest a.  Had CABG 2 years ago.  Patient had her blood pressure medicine for the last few days.  Patient has history of diabetes along with aortic stenosis. Past Medical History  Diagnosis Date  . Diabetes mellitus   . Hypertension   . Aortic stenosis     s/p AVR August 2012  . Hypercholesterolemia   . OA (osteoarthritis)   . CAD (coronary artery disease)     s/p CABG x 15 August 2010  . Hyperlipidemia   . Degenerative arthritis of knee   . Obesity    Past Surgical History  Procedure Laterality Date  . Cardiac catheterization  07/25/2010    Dr Sw[place]and  . Coronary artery bypass graft  08/17/10    x1, Dr Laneta Simmers with LIMA to LAD  . Aortic valve replacement  08/17/2010    using a 21-mm Edwards pericardial Magna - Ease valve.   . Back surgery    . Total knee arthroplasty Right 08/26/2012    Dr Eulah Pont  . Total knee arthroplasty Right 08/26/2012    Procedure: TOTAL KNEE ARTHROPLASTY;  Surgeon: Loreta Ave, MD;  Location: [facility] OR;  Service: Orthopedics;  Laterality: Right;   Family History  Problem Relation Age of Onset  . Tuberculosis Mother   . Heart disease Father    History  Substance Use Topics  . Smoking status: Never Smoker   . Smokeless tobacco: Never Used  . Alcohol Use: Yes     Comment: rarely   OB History   Grav Para Term Preterm Abortions TAB SAB Ect Mult Living                 Review of Systems  All other systems reviewed and are negative  Allergies  Crestor; Lipitor; Norvasc; and Aspirin  Home Medications   Prior to Admission medications   Medication Sig Start Date End Date Taking? Authorizing Provider  clopidogrel (PLAVIX) 75 MG tablet Take 1 tablet (75 mg total) by mouth daily. 09/19/12  Yes Sherren Mocha, MD  ibuprofen  (ADVIL,MOTRIN) 200 MG tablet Take 400 mg by mouth every 6 (six) hours as needed for headache.   Yes Historical Provider, MD  metFORMIN (GLUCOPHAGE) 1000 MG tablet Take 1 tablet (1,000 mg total) by mouth 2 (two) times daily with a meal. 08/28/12 10/16/13 Yes Kirstin J Shepperson, PA-C  metoprolol tartrate (LOPRESSOR) 25 MG tablet Take 1 tablet (25 mg total) by mouth 2 (two) times daily. 09/19/12  Yes Sherren Mocha, MD  pravastatin (PRAVACHOL) 20 MG tablet Take 1 tablet (20 mg total) by mouth every evening. 10/20/12  Yes Rosalio Macadamia, NP  valsartan-hydrochlorothiazide (DIOVAN-HCT) 320-25 MG per tablet Take 1 tablet by mouth daily.   Yes Historical Provider, MD   BP 151/58  Pulse 83  Temp(Src) 99.1 F (37.3 C) (Oral)  Resp 20  Ht  (1.549 m)  Wt 176 lb 9.5 oz (80.102 kg)  BMI 33.38 kg/m2  SpO2 99% Physical Exam Physical Exam  Nursing note and vitals reviewed. Constitutional: She is oriented to person, place, and time. She appears well-developed and well-nourished. No distress.  HENT:  Head: Normocephalic and atraumatic.  Eyes: Pupils are equal, round, and reactive  to light.  Neck: Normal range of motion.  Cardiovascular: Normal rate and intact distal pulses.   Pulmonary/Chest: No respiratory distress.  Abdominal: Normal appearance. She exhibits no distension.  Musculoskeletal: Normal range of motion.  Neurological: She is alert and oriented to person, place, and time. No cranial nerve deficit.  Skin: Skin is warm and dry. No rash noted.  Psychiatric: She has a normal mood and affect. Her behavior is normal.   ED Course  Procedures (including critical care time)  CRITICAL CARE Performed by: Nelva Nay L Total critical care time: 30 min Critical care time was exclusive of separately billable procedures and treating other patients. Critical care was necessary to treat or prevent imminent or life-threatening deterioration. Critical care was time spent personally by me on the  following activities: development of treatment plan with patient and/or surrogate as well as nursing, discussions with consultants, evaluation of patient's response to treatment, examination of patient, obtaining history from patient or surrogate, ordering and performing treatments and interventions, ordering and review of laboratory studies, ordering and review of radiographic studies, pulse oximetry and re-evaluation of patient's condition.   Medications  nitroGLYCERIN (NITROSTAT) SL tablet 0.4 mg (not administered)  acetaminophen (TYLENOL) tablet 650 mg (not administered)  ondansetron (ZOFRAN) injection 4 mg (not administered)  0.9 %  sodium chloride infusion ( Intravenous New Bag/Given 09/24/13 2240)  simvastatin (ZOCOR) tablet 10 mg (not administered)  clopidogrel (PLAVIX) tablet 75 mg (75 mg Oral Given 09/25/13 0958)  metoprolol tartrate (LOPRESSOR) tablet 25 mg (25 mg Oral Given 09/24/13 2310)  insulin aspart (novoLOG) injection 0-9 Units (2 Units Subcutaneous Given 09/25/13 0854)  heparin ADULT infusion 100 units/mL (25000 units/250 mL) (750 Units/hr Intravenous New Bag/Given 09/24/13 2240)  irbesartan (AVAPRO) tablet 300 mg (300 mg Oral Given 09/25/13 0959)    And  hydrochlorothiazide (HYDRODIURIL) tablet 25 mg (25 mg Oral Given 09/25/13 0959)  regadenoson (LEXISCAN) injection SOLN 0.4 mg (not administered)  technetium sestamibi (CARDIOLITE) injection 30 milli Curie (not administered)  heparin bolus via infusion 2,000 Units (not administered)  regadenoson (LEXISCAN) 0.4 MG/5ML injection SOLN (not administered)  heparin bolus via infusion 3,500 Units (3,500 Units Intravenous Given 09/24/13 2240)  technetium sestamibi (CARDIOLITE) injection 10 milli Curie (10 milli Curies Intravenous Contrast Given 09/25/13 1048)    Labs Review Labs Reviewed  CBC - Abnormal; Notable for the following:    Hemoglobin 11.1 (*)    HCT 35.2 (*)    MCH 24.8 (*)    RDW 16.7 (*)    All other components within  normal limits  BASIC METABOLIC PANEL - Abnormal; Notable for the following:    Glucose, Bld 140 (*)    GFR calc non Af Amer 67 (*)    GFR calc Af Amer 77 (*)    All other components within normal limits  HEMOGLOBIN A1C - Abnormal; Notable for the following:    Hemoglobin A1C 8.5 (*)    Mean Plasma Glucose 197 (*)    All other components within normal limits  BASIC METABOLIC PANEL - Abnormal; Notable for the following:    Glucose, Bld 155 (*)    GFR calc non Af Amer 61 (*)    GFR calc Af Amer 71 (*)    All other components within normal limits  CBC - Abnormal; Notable for the following:    Hemoglobin 10.2 (*)    HCT 32.7 (*)    MCV 76.9 (*)    MCH 24.0 (*)    RDW 16.7 (*)  All other components within normal limits  HEPATIC FUNCTION PANEL - Abnormal; Notable for the following:    Albumin 2.6 (*)    All other components within normal limits  HEPARIN LEVEL (UNFRACTIONATED) - Abnormal; Notable for the following:    Heparin Unfractionated <0.10 (*)    All other components within normal limits  GLUCOSE, CAPILLARY - Abnormal; Notable for the following:    Glucose-Capillary 206 (*)    All other components within normal limits  LIPID PANEL - Abnormal; Notable for the following:    Cholesterol 251 (*)    LDL Cholesterol 150 (*)    All other components within normal limits  GLUCOSE, CAPILLARY - Abnormal; Notable for the following:    Glucose-Capillary 173 (*)    All other components within normal limits  TROPONIN I  MAGNESIUM  TSH  T4, FREE  TROPONIN I  TROPONIN I  TROPONIN I  PROTIME-INR  HEPARIN LEVEL (UNFRACTIONATED)    Imaging Review Dg Chest 2 View  09/24/2013   CLINICAL DATA:  79 year old female with chest pain. Initial encounter.  EXAM: CHEST  2 VIEW  COMPARISON:  09/06/2012 and earlier.  FINDINGS: Stable cardiac size and mediastinal contours. Cardiac valve replacement re- identified. Visualized tracheal air column is within normal limits. Stable lung volumes. No  pneumothorax, pulmonary edema, pleural effusion or confluent pulmonary opacity. No acute osseous abnormality identified.  IMPRESSION: No acute cardiopulmonary abnormality.   Electronically Signed   By: Augusto Gamble M.D.   On: 09/24/2013 16:44     EKG Interpretation   Date/Time:  Friday September 24 2013 15:01:16 EDT Ventricular Rate:  91 PR Interval:  124 QRS Duration: 82 QT Interval:  352 QTC Calculation: 432 R Axis:   -10 Text Interpretation:  Normal sinus rhythm ST \\T \ T wave abnormality,  consider lateral ischemia Abnormal ECG Confirmed by Julieanna Geraci  MD, Taitum Alms  ([id number]) on 09/24/2013 3:45:01 PM     Cardiology consulted and will see the patient emergency room. MDM   Final diagnoses:  Chest pain, unspecified chest pain type        Nelia Shi, MD 09/25/13 1056

## 2013-09-24 NOTE — Telephone Encounter (Signed)
Returned call to patient >> patient is already on way to [facility] ED, sister is driving. They are coming from [place]. Patient describes chest pain, onset about 10am.   3-4 out of 10 pain - sometimes worse.   Patient DENIES shortness of breath, difficulty breathing, lightheadedness/dizziness  Trish was notified and ED nurse.

## 2013-09-24 NOTE — ED Notes (Signed)
Report attempted RN to call back in 5 min.

## 2013-09-24 NOTE — Progress Notes (Signed)
ANTICOAGULATION CONSULT NOTE - Initial Consult  Pharmacy Consult for Heparin Indication: chest pain/ACS  Allergies  Allergen Reactions  . Crestor [Rosuvastatin Calcium] Other (See Comments)    Breathing issues and chest pain  . Lipitor [Atorvastatin Calcium] Other (See Comments)    Breathing issues and chest pain  . Norvasc [Amlodipine Besylate] Swelling  . Aspirin Other (See Comments)    unknown    Patient Measurements: Height:  (154.9 cm) Weight: 175 lb (79.379 kg) IBW/kg (Calculated) : 47.8 x 1.25 = 59kg Heparin Dosing Weight: 65 kg  Vital Signs: Temp: 98.5 F (36.9 C) (09/11 2059) Temp src: Oral (09/11 2059) BP: 157/51 mmHg (09/11 2015) Pulse Rate: 86 (09/11 2015)  Labs:  Recent Labs  09/24/13 1507 09/24/13 1514  HGB 11.1*  --   HCT 35.2*  --   PLT 244  --   CREATININE 0.83  --   TROPONINI  --  <0.30    Estimated Creatinine Clearance: 55 ml/min (by C-G formula based on Cr of 0.83).   Medical History: Past Medical History  Diagnosis Date  . Diabetes mellitus   . Hypertension   . Aortic stenosis     s/p AVR August 2012  . Hypercholesterolemia   . OA (osteoarthritis)   . CAD (coronary artery disease)     s/p CABG x 15 August 2010  . Hyperlipidemia   . Degenerative arthritis of knee   . Obesity     Medications:   (Not in a hospital admission)  Assessment: 79 yo F admitted 09/24/2013 with radiating CP.  Pharmacy consulted to dose heparin.  PMH:  DM, HTN, hyperlipidemia, CAD CABG/AVR(tissue valve) 2012 (not on warfarin PTA)  Coag/Heme: ACS, troponin negative, H/H low Goal of Therapy:  Heparin level 0.3-0.7 units/ml Monitor platelets by anticoagulation protocol: Yes   Plan:  Heparin 3500 units IV x1 then 750 units/hr Check heparin level 8h after ggt starts, with am labs Daily CBC and heparin level.  Thank you for allowing pharmacy to be a part of this patients care team.  Lovenia Kim Pharm.D., BCPS, AQ-Cardiology Clinical  Pharmacist 09/24/2013 9:14 PM Pager: [phone] Phone: [phone]

## 2013-09-25 ENCOUNTER — Inpatient Hospital Stay (HOSPITAL_COMMUNITY): Payer: Medicare Other

## 2013-09-25 ENCOUNTER — Other Ambulatory Visit: Payer: Self-pay | Admitting: Physician Assistant

## 2013-09-25 DIAGNOSIS — Z952 Presence of prosthetic heart valve: Secondary | ICD-10-CM

## 2013-09-25 DIAGNOSIS — R079 Chest pain, unspecified: Secondary | ICD-10-CM | POA: Diagnosis not present

## 2013-09-25 DIAGNOSIS — D649 Anemia, unspecified: Secondary | ICD-10-CM | POA: Diagnosis not present

## 2013-09-25 DIAGNOSIS — I5189 Other ill-defined heart diseases: Secondary | ICD-10-CM | POA: Diagnosis not present

## 2013-09-25 DIAGNOSIS — E119 Type 2 diabetes mellitus without complications: Secondary | ICD-10-CM | POA: Diagnosis not present

## 2013-09-25 DIAGNOSIS — I200 Unstable angina: Secondary | ICD-10-CM | POA: Diagnosis not present

## 2013-09-25 DIAGNOSIS — I10 Essential (primary) hypertension: Secondary | ICD-10-CM | POA: Diagnosis not present

## 2013-09-25 DIAGNOSIS — E785 Hyperlipidemia, unspecified: Secondary | ICD-10-CM

## 2013-09-25 DIAGNOSIS — I209 Angina pectoris, unspecified: Secondary | ICD-10-CM

## 2013-09-25 DIAGNOSIS — E7800 Pure hypercholesterolemia, unspecified: Secondary | ICD-10-CM | POA: Diagnosis not present

## 2013-09-25 DIAGNOSIS — I2510 Atherosclerotic heart disease of native coronary artery without angina pectoris: Secondary | ICD-10-CM | POA: Diagnosis not present

## 2013-09-25 LAB — BASIC METABOLIC PANEL
ANION GAP: 11 (ref 5–15)
BUN: 15 mg/dL (ref 6–23)
CO2: 24 meq/L (ref 19–32)
Calcium: 8.5 mg/dL (ref 8.4–10.5)
Chloride: 102 mEq/L (ref 96–112)
Creatinine, Ser: 0.89 mg/dL (ref 0.50–1.10)
GFR calc Af Amer: 71 mL/min — ABNORMAL LOW (ref >90)
GFR, EST NON AFRICAN AMERICAN: 61 mL/min — AB (ref >90)
Glucose, Bld: 155 mg/dL — ABNORMAL HIGH (ref 70–99)
Potassium: 4.3 mEq/L (ref 3.7–5.3)
SODIUM: 137 meq/L (ref 137–147)

## 2013-09-25 LAB — T4, FREE: Free T4: 1.1 ng/dL (ref 0.80–1.80)

## 2013-09-25 LAB — HEPARIN LEVEL (UNFRACTIONATED)

## 2013-09-25 LAB — LIPID PANEL
Cholesterol: 251 mg/dL — ABNORMAL HIGH (ref 0–200)
HDL: 77 mg/dL (ref >39)
LDL Cholesterol: 150 mg/dL — ABNORMAL HIGH (ref 0–99)
Total CHOL/HDL Ratio: 3.3 RATIO
Triglycerides: 118 mg/dL (ref <150)
VLDL: 24 mg/dL (ref 0–40)

## 2013-09-25 LAB — CBC
HEMATOCRIT: 32.7 % — AB (ref 36.0–46.0)
Hemoglobin: 10.2 g/dL — ABNORMAL LOW (ref 12.0–15.0)
MCH: 24 pg — ABNORMAL LOW (ref 26.0–34.0)
MCHC: 31.2 g/dL (ref 30.0–36.0)
MCV: 76.9 fL — AB (ref 78.0–100.0)
PLATELETS: 241 10*3/uL (ref 150–400)
RBC: 4.25 MIL/uL (ref 3.87–5.11)
RDW: 16.7 % — ABNORMAL HIGH (ref 11.5–15.5)
WBC: 6.4 10*3/uL (ref 4.0–10.5)

## 2013-09-25 LAB — HEPATIC FUNCTION PANEL
ALT: 11 U/L (ref 0–35)
AST: 17 U/L (ref 0–37)
Albumin: 2.6 g/dL — ABNORMAL LOW (ref 3.5–5.2)
Alkaline Phosphatase: 82 U/L (ref 39–117)
BILIRUBIN TOTAL: 0.3 mg/dL (ref 0.3–1.2)
Total Protein: 6.7 g/dL (ref 6.0–8.3)

## 2013-09-25 LAB — GLUCOSE, CAPILLARY
GLUCOSE-CAPILLARY: 173 mg/dL — AB (ref 70–99)
GLUCOSE-CAPILLARY: 268 mg/dL — AB (ref 70–99)
Glucose-Capillary: 206 mg/dL — ABNORMAL HIGH (ref 70–99)

## 2013-09-25 LAB — HEMOGLOBIN A1C
Hgb A1c MFr Bld: 8.5 % — ABNORMAL HIGH (ref <5.7)
MEAN PLASMA GLUCOSE: 197 mg/dL — AB (ref <117)

## 2013-09-25 LAB — TROPONIN I: Troponin I: <0.3 ng/mL (ref <0.30)

## 2013-09-25 MED ORDER — TECHNETIUM TC 99M SESTAMIBI - CARDIOLITE
30.0000 | Freq: Once | INTRAVENOUS | Status: AC | PRN
  Administered 2013-09-25: 30 via INTRAVENOUS

## 2013-09-25 MED ORDER — HEPARIN BOLUS VIA INFUSION
2000.0000 [IU] | Freq: Once | INTRAVENOUS | Status: DC
  Filled 2013-09-25: qty 2000

## 2013-09-25 MED ORDER — REGADENOSON 0.4 MG/5ML IV SOLN
INTRAVENOUS | Status: AC
  Administered 2013-09-25: 0.4 mg via INTRAVENOUS
  Filled 2013-09-25: qty 5

## 2013-09-25 MED ORDER — HEPARIN BOLUS VIA INFUSION
2400.0000 [IU] | Freq: Once | INTRAVENOUS | Status: DC
  Filled 2013-09-25: qty 2400

## 2013-09-25 MED ORDER — PRAVASTATIN SODIUM 40 MG PO TABS: 40.0000 mg | ORAL_TABLET | Freq: Every evening | ORAL | Status: DC

## 2013-09-25 MED ORDER — DEXTROSE 50 % IV SOLN
INTRAVENOUS | Status: AC
  Filled 2013-09-25: qty 50

## 2013-09-25 MED ORDER — REGADENOSON 0.4 MG/5ML IV SOLN
0.4000 mg | Freq: Once | INTRAVENOUS | Status: AC
  Administered 2013-09-25: 0.4 mg via INTRAVENOUS
  Filled 2013-09-25: qty 5

## 2013-09-25 MED ORDER — HEPARIN BOLUS VIA INFUSION
2000.0000 [IU] | Freq: Once | INTRAVENOUS | Status: AC
  Administered 2013-09-25: 2000 [IU] via INTRAVENOUS
  Filled 2013-09-25: qty 2000

## 2013-09-25 MED ORDER — TECHNETIUM TC 99M SESTAMIBI - CARDIOLITE
10.0000 | Freq: Once | INTRAVENOUS | Status: AC | PRN
  Administered 2013-09-25: 11:00:00 10 via INTRAVENOUS

## 2013-09-25 NOTE — Discharge Instructions (Signed)
Diabetes Mellitus and Food It is important for you to manage your blood sugar (glucose) level. Your blood glucose level can be greatly affected by what you eat. Eating healthier foods in the appropriate amounts throughout the day at about the same time each day will help you control your blood glucose level. It can also help slow or prevent worsening of your diabetes mellitus. Healthy eating may even help you improve the level of your blood pressure and reach or maintain a healthy weight.  HOW CAN FOOD AFFECT ME? Carbohydrates Carbohydrates affect your blood glucose level more than any other type of food. Your dietitian will help you determine how many carbohydrates to eat at each meal and teach you how to count carbohydrates. Counting carbohydrates is important to keep your blood glucose at a healthy level, especially if you are using insulin or taking certain medicines for diabetes mellitus. Alcohol Alcohol can cause sudden decreases in blood glucose (hypoglycemia), especially if you use insulin or take certain medicines for diabetes mellitus. Hypoglycemia can be a life-threatening condition. Symptoms of hypoglycemia (sleepiness, dizziness, and disorientation) are similar to symptoms of having too much alcohol.  If your health care provider has given you approval to drink alcohol, do so in moderation and use the following guidelines:  Women should not have more than one drink per day, and men should not have more than two drinks per day. One drink is equal to:  12 oz of beer.  5 oz of wine.  1 oz of hard liquor.  Do not drink on an empty stomach.  Keep yourself hydrated. Have water, diet soda, or unsweetened iced tea.  Regular soda, juice, and other mixers might contain a lot of carbohydrates and should be counted. WHAT FOODS ARE NOT RECOMMENDED? As you make food choices, it is important to remember that all foods are not the same. Some foods have fewer nutrients per serving than other  foods, even though they might have the same number of calories or carbohydrates. It is difficult to get your body what it needs when you eat foods with fewer nutrients. Examples of foods that you should avoid that are high in calories and carbohydrates but low in nutrients include:  Trans fats (most processed foods list trans fats on the Nutrition Facts label).  Regular soda.  Juice.  Candy.  Sweets, such as cake, pie, doughnuts, and cookies.  Fried foods. WHAT FOODS CAN I EAT? Have nutrient-rich foods, which will nourish your body and keep you healthy. The food you should eat also will depend on several factors, including:  The calories you need.  The medicines you take.  Your weight.  Your blood glucose level.  Your blood pressure level.  Your cholesterol level. You also should eat a variety of foods, including:  Protein, such as meat, poultry, fish, tofu, nuts, and seeds (lean animal proteins are best).  Fruits.  Vegetables.  Dairy products, such as milk, cheese, and yogurt (low fat is best).  Breads, grains, pasta, cereal, rice, and beans.  Fats such as olive oil, trans fat-free margarine, canola oil, avocado, and olives. DOES EVERYONE WITH DIABETES MELLITUS HAVE THE SAME MEAL PLAN? Because every person with diabetes mellitus is different, there is not one meal plan that works for everyone. It is very important that you meet with a dietitian who will help you create a meal plan that is just right for you. Document Released: 09/27/2004 Document Revised: 01/05/2013 Document Reviewed: 11/27/2012 Exit[employer] Patient Information 2015 [employer]. This   information is not intended to replace advice given to you by your health care provider. Make sure you discuss any questions you have with your health care provider.  

## 2013-09-25 NOTE — Progress Notes (Signed)
Nuclear stress test results reviewed.  Low risk scan with no inducible ischemia and normal EF.  OK for discharge today.  Followup with Dr. Swaziland in 1-2 weeks

## 2013-09-25 NOTE — Progress Notes (Addendum)
SUBJECTIVE:  No further CP  OBJECTIVE:   Vitals:   Filed Vitals:   09/24/13 2335 09/25/13 0454 09/25/13 0457 09/25/13 0500  BP: 159/60 174/79 171/69 151/58  Pulse:  83    Temp:  99.1 F (37.3 C)    TempSrc:  Oral    Resp:  20    Height:      Weight:  176 lb 9.5 oz (80.102 kg)    SpO2:  99%     I&O's:  No intake or output data in the 24 hours ending 09/25/13 1042 TELEMETRY: Reviewed telemetry pt in NSR:     PHYSICAL EXAM General: Well developed, well nourished, in no acute distress Head: Eyes PERRLA, No xanthomas.   Normal cephalic and atramatic  Lungs:   Clear bilaterally to auscultation and percussion. Heart:   HRRR S1 S2 Pulses are 2+ & equal. Abdomen: Bowel sounds are positive, abdomen soft and non-tender without masses  Extremities:   No clubbing, cyanosis or edema.  DP +1 Neuro: Alert and oriented X 3. Psych:  Good affect, responds appropriately   LABS: Basic Metabolic Panel:  Recent Labs  16/10/96 1507 09/24/13 2132 09/25/13 0528  NA 138  --  137  K 4.8  --  4.3  CL 99  --  102  CO2 26  --  24  GLUCOSE 140*  --  155*  BUN 14  --  15  CREATININE 0.83  --  0.89  CALCIUM 9.0  --  8.5  MG  --  1.9  --    Liver Function Tests:  Recent Labs  09/25/13 0528  AST 17  ALT 11  ALKPHOS 82  BILITOT 0.3  PROT 6.7  ALBUMIN 2.6*   No results found for this basename: LIPASE, AMYLASE,  in the last 72 hours CBC:  Recent Labs  09/24/13 1507 09/25/13 0528  WBC 7.8 6.4  HGB 11.1* 10.2*  HCT 35.2* 32.7*  MCV 78.6 76.9*  PLT 244 241   Cardiac Enzymes:  Recent Labs  09/24/13 2132 09/25/13 0528 09/25/13 0855  TROPONINI <0.30 <0.30 <0.30   BNP: No components found with this basename: POCBNP,  D-Dimer: No results found for this basename: DDIMER,  in the last 72 hours Hemoglobin A1C:  Recent Labs  09/24/13 2132  HGBA1C 8.5*   Fasting Lipid Panel:  Recent Labs  09/25/13 0528  CHOL 251*  HDL 77  LDLCALC 150*  TRIG 118  CHOLHDL 3.3    Thyroid Function Tests:  Recent Labs  09/24/13 2132  TSH 2.810   Anemia Panel: No results found for this basename: VITAMINB12, FOLATE, FERRITIN, TIBC, IRON, RETICCTPCT,  in the last 72 hours Coag Panel:   Lab Results  Component Value Date   INR 1.15 09/24/2013   INR 0.97 08/21/2012   INR 1.49 08/17/2010    RADIOLOGY: Dg Chest 2 View  09/24/2013   CLINICAL DATA:  79 year old female with chest pain. Initial encounter.  EXAM: CHEST  2 VIEW  COMPARISON:  09/06/2012 and earlier.  FINDINGS: Stable cardiac size and mediastinal contours. Cardiac valve replacement re- identified. Visualized tracheal air column is within normal limits. Stable lung volumes. No pneumothorax, pulmonary edema, pleural effusion or confluent pulmonary opacity. No acute osseous abnormality identified.  IMPRESSION: No acute cardiopulmonary abnormality.   Electronically Signed   By: Augusto Gamble M.D.   On: 09/24/2013 16:44   ASSESSMENT:  1. Unstable angina. It has been on and off all day and then became constant and patient presented to  ER. Her initial troponin is normal. Her EKG has new subtle T wave inversions in the inferior leads but the T wave inversions in the lateral precordial leads are old. She currently is pain free. She had a stress myoview 09/2012 that showed no ischemia. She has rule out for MI by serial enzymes. 2. ASCAD s/p LIMA to LAD 2012  3. Severe AS s/p pericardial AVR 2012  4. HTN - poorly controlled 5. Dylipidemia - intolerant to Lipitor and Crestor due to breathing issues and CP now on Pravachol with LDL 150 6. DM per PCP  PLAN:  1.  Continue ASA/statin/Plavix/ARB and BB  2.  NPO  3.  Lexiscan myoview today 4.  Increase pravastatin to  daily at discharge with FLP and ALT in 6 weeks  D/C home if nuclear stress test is normal   Quintella Reichert, MD  09/25/2013  10:42 AM

## 2013-09-25 NOTE — Progress Notes (Signed)
Lexiscan CL performed 

## 2013-09-25 NOTE — Progress Notes (Signed)
ANTICOAGULATION CONSULT NOTE - Follow Up Consult  Pharmacy Consult for Heparin Indication: chest pain/ACS  Allergies  Allergen Reactions  . Crestor [Rosuvastatin Calcium] Other (See Comments)    Breathing issues and chest pain  . Lipitor [Atorvastatin Calcium] Other (See Comments)    Breathing issues and chest pain  . Norvasc [Amlodipine Besylate] Swelling  . Aspirin Other (See Comments)    unknown    Patient Measurements: Height:  (154.9 cm) Weight: 176 lb 9.5 oz (80.102 kg) IBW/kg (Calculated) : 47.8 x 1.25 = 59kg Heparin Dosing Weight: 65 kg  Vital Signs: Temp: 99.1 F (37.3 C) (09/12 0454) Temp src: Oral (09/12 0454) BP: 151/58 mmHg (09/12 0500) Pulse Rate: 83 (09/12 0454)  Labs:  Recent Labs  09/24/13 1507  09/24/13 2132 09/25/13 0528 09/25/13 0855  HGB 11.1*  --   --  10.2*  --   HCT 35.2*  --   --  32.7*  --   PLT 244  --   --  241  --   LABPROT  --   --  14.7  --   --   INR  --   --  1.15  --   --   HEPARINUNFRC  --   --   --  <0.10*  --   CREATININE 0.83  --   --  0.89  --   TROPONINI  --   < > <0.30 <0.30 <0.30  < > = values in this interval not displayed.  Estimated Creatinine Clearance: 51.5 ml/min (by C-G formula based on Cr of 0.89).   Medical History: Past Medical History  Diagnosis Date  . Diabetes mellitus   . Hypertension   . Aortic stenosis     s/p AVR August 2012  . Hypercholesterolemia   . OA (osteoarthritis)   . CAD (coronary artery disease)     s/p CABG x 15 August 2010  . Hyperlipidemia   . Degenerative arthritis of knee   . Obesity     Medications:  Prescriptions prior to admission  Medication Sig Dispense Refill  . clopidogrel (PLAVIX) 75 MG tablet Take 1 tablet (75 mg total) by mouth daily.  90 tablet  3  . ibuprofen (ADVIL,MOTRIN) 200 MG tablet Take 400 mg by mouth every 6 (six) hours as needed for headache.      . metFORMIN (GLUCOPHAGE) 1000 MG tablet Take 1 tablet (1,000 mg total) by mouth 2 (two) times daily  with a meal.  180 tablet  3  . metoprolol tartrate (LOPRESSOR) 25 MG tablet Take 1 tablet (25 mg total) by mouth 2 (two) times daily.  180 tablet  3  . pravastatin (PRAVACHOL) 20 MG tablet Take 1 tablet (20 mg total) by mouth every evening.  30 tablet  6  . valsartan-hydrochlorothiazide (DIOVAN-HCT) 320-25 MG per tablet Take 1 tablet by mouth daily.        Assessment: 79 yo F admitted 09/24/2013 with radiating CP.  Pharmacy consulted to dose heparin. HL level back on 9/12 at <0.1. Will rebolus and increase drip rate using heparin dosing weight.  PMH: DM, HTN, hyperlipidemia, CAD CABG/AVR(tissue valve) 2012 (not on warfarin PTA)  Coag/Heme: r/o ACS, troponin negative, H/H low stable  Goal of Therapy:  Heparin level 0.3-0.7 units/ml Monitor platelets by anticoagulation protocol: Yes   Plan:  Rebolus heparin 2000 units IV x 1 Increase heparin drip to 950 units/hr IV Check heparin level in 8 hours (2000) Daily CBC and heparin level.  Leighanne Adolph E. Werner Labella, Pharm.D  Clinical Pharmacy Resident Pager: [phone] 09/25/2013 10:51 AM

## 2013-09-25 NOTE — Discharge Summary (Signed)
CARDIOLOGY DISCHARGE SUMMARY   Patient ID: ALIEA BOBE MRN: [id number] DOB/AGE: 1934/11/22 79 y.o.  Admit date: 09/24/2013 Discharge date: 09/25/2013  PCP: Shade Flood, MD Primary Cardiologist: Dr. Swaziland  Primary Discharge Diagnosis:  Unstable angina - medical therapy for CAD Secondary Discharge Diagnosis:  Type 2 diabetes Anemia Hyperlipidemia  Procedures: Lexi scan [facility] Course: [name] [name] is a 79 y.o. female with a history of CAD. She had intermittent chest pain on the day of admission and finally came to the hospital where she was admitted for further evaluation and treatment.  Her cardiac enzymes were negative. Her ECG had no acute ischemic changes. She was pain-free after admission on aspirin, Plavix, beta blocker and statin. A hemoglobin A1c was checked and was elevated at 8.5. She is encouraged to stick tightly to a diabetic diet, be compliant with medications and followup with primary care.  On 09/12, her enzymes have remained negative. A Lexi scan Myoview was performed, results below. She had septal hypokinesis but no ischemia and her EF is preserved at 57%. She was seen by Dr. Mayford Knife and all data were reviewed. Her lipid profile showed an elevated LDL and her Pravachol was increased from 20 mg up to 40 mg daily. She has had poor tolerance for statins in the past and it is hoped she will tolerate an increased dose of Pravachol. She was noted to be mildly anemic but her hemoglobin and hematocrit were stable and consistent with values from over a year ago. Her MCV was slightly low at 76.9 and she is to follow up with primary care.  Ms. Brittingham was ambulating without chest pain or shortness of breath and no further inpatient workup was indicated. She is considered stable for discharge, to follow up as an outpatient.  Labs:   Lab Results  Component Value Date   WBC 6.4 09/25/2013   HGB 10.2* 09/25/2013   HCT 32.7* 09/25/2013   MCV 76.9*  09/25/2013   PLT 241 09/25/2013     Recent Labs Lab 09/25/13 0528  NA 137  K 4.3  CL 102  CO2 24  BUN 15  CREATININE 0.89  CALCIUM 8.5  PROT 6.7  BILITOT 0.3  ALKPHOS 82  ALT 11  AST 17  GLUCOSE 155*    Recent Labs  09/24/13 2132 09/25/13 0528 09/25/13 0855  TROPONINI <0.30 <0.30 <0.30   Lipid Panel     Component Value Date/Time   CHOL 251* 09/25/2013 0528   TRIG 118 09/25/2013 0528   HDL 77 09/25/2013 0528   CHOLHDL 3.3 09/25/2013 0528   VLDL 24 09/25/2013 0528   LDLCALC 150* 09/25/2013 0528    Recent Labs  09/24/13 2132  INR 1.15   Lab Results  Component Value Date   HGBA1C 8.5* 09/24/2013      Radiology: Dg Chest 2 View 09/24/2013   CLINICAL DATA:  79 year old female with chest pain. Initial encounter.  EXAM: CHEST  2 VIEW  COMPARISON:  09/06/2012 and earlier.  FINDINGS: Stable cardiac size and mediastinal contours. Cardiac valve replacement re- identified. Visualized tracheal air column is within normal limits. Stable lung volumes. No pneumothorax, pulmonary edema, pleural effusion or confluent pulmonary opacity. No acute osseous abnormality identified.  IMPRESSION: No acute cardiopulmonary abnormality.   Electronically Signed   By: Augusto Gamble M.D.   On: 09/24/2013 16:44   Nm Myocar Multi W/spect W/wall Motion / Ef 09/25/2013   CLINICAL DATA:  angina  EXAM: MYOCARDIAL IMAGING WITH SPECT (  REST AND PHARMACOLOGIC-STRESS)  GATED LEFT VENTRICULAR WALL MOTION STUDY  LEFT VENTRICULAR EJECTION FRACTION  TECHNIQUE: Standard myocardial SPECT imaging was performed after resting intravenous injection of 10 mCi Tc-3m sestamibi. Subsequently, intravenous infusion of Lexiscan was performed under the supervision of the Cardiology staff. At peak effect of the drug, 30 mCi Tc-6m sestamibi was injected intravenously and standard myocardial SPECT imaging was performed. Quantitative gated imaging was also performed to evaluate left ventricular wall motion, and estimate left ventricular  ejection fraction.  COMPARISON:  None.  FINDINGS: Perfusion: No decreased activity in the left ventricle on stress imaging to suggest reversible ischemia. Mild apical thinning or scarring.  Wall Motion: Septal hypokinesis.  No left ventricular dilation.  Left Ventricular Ejection Fraction: 57 %  End diastolic volume 35 ml  End systolic volume 80 ml  IMPRESSION: 1. No reversible ischemia.  Apical thinning.  2. Septal hypokinesis.  3. Left ventricular ejection fraction 57%  4. Low-risk stress test findings*.  *2012 Appropriate Use Criteria for Coronary Revascularization Focused Update: J Am Coll Cardiol. 2012;59(9):857-881. http://content.dementiazones.com.aspx?articleid=[email or web address]   Electronically Signed   By: Oley Balm M.D.   On: 09/25/2013 15:08   EKG: 09/25/2013 Sinus rhythm, no acute ischemic changes Vent. rate 75 BPM PR interval 128 ms QRS duration 84 ms QT/QTc 408/455 ms P-R-T axes 53 0 73  FOLLOW UP PLANS AND APPOINTMENTS Allergies  Allergen Reactions  . Crestor [Rosuvastatin Calcium] Other (See Comments)    Breathing issues and chest pain  . Lipitor [Atorvastatin Calcium] Other (See Comments)    Breathing issues and chest pain  . Norvasc [Amlodipine Besylate] Swelling  . Aspirin Other (See Comments)    unknown     Medication List         clopidogrel 75 MG tablet  Commonly known as:  PLAVIX  Take 1 tablet (75 mg total) by mouth daily.     ibuprofen 200 MG tablet  Commonly known as:  ADVIL,MOTRIN  Take 400 mg by mouth every 6 (six) hours as needed for headache.     metFORMIN 1000 MG tablet  Commonly known as:  GLUCOPHAGE  Take 1 tablet (1,000 mg total) by mouth 2 (two) times daily with a meal.     metoprolol tartrate 25 MG tablet  Commonly known as:  LOPRESSOR  Take 1 tablet (25 mg total) by mouth 2 (two) times daily.     pravastatin 40 MG tablet  Commonly known as:  PRAVACHOL  Take 1 tablet (40 mg total) by mouth every evening.      valsartan-hydrochlorothiazide 320-25 MG per tablet  Commonly known as:  DIOVAN-HCT  Take 1 tablet by mouth daily.        Discharge Instructions   Diet - low sodium heart healthy    Complete by:  As directed      Diet Carb Modified    Complete by:  As directed      Increase activity slowly    Complete by:  As directed           Follow-up Information   Follow up with Peter Swaziland, MD. (The office will call)    Specialty:  Cardiology   Contact information:   [address] [place] [address] [phone]       Follow up with Shade Flood, MD. (Call for an appointment to manage diabetes and anemia)    Specialties:  Family Medicine, Sports Medicine   Contact information:   [address] [place] [address] [phone]  BRING ALL MEDICATIONS WITH YOU TO FOLLOW UP APPOINTMENTS  Time spent with patient to include physician time: 36 min Signed: Theodore Demark, PA-C 09/25/2013, 5:15 PM Co-Sign MD

## 2013-09-26 ENCOUNTER — Other Ambulatory Visit: Payer: Self-pay | Admitting: Family Medicine

## 2013-09-26 NOTE — Discharge Summary (Signed)
Agree with discharge summary as outlined by Rhonda Barrett PA-C 

## 2013-09-27 ENCOUNTER — Telehealth (HOSPITAL_COMMUNITY): Payer: Self-pay | Admitting: *Deleted

## 2013-09-29 ENCOUNTER — Telehealth: Payer: Self-pay | Admitting: Cardiology

## 2013-10-01 NOTE — Telephone Encounter (Signed)
Closed encounter °

## 2013-10-05 ENCOUNTER — Ambulatory Visit (INDEPENDENT_AMBULATORY_CARE_PROVIDER_SITE_OTHER): Payer: Medicare Other | Admitting: Cardiology

## 2013-10-05 ENCOUNTER — Encounter: Payer: Self-pay | Admitting: Cardiology

## 2013-10-05 VITALS — BP 166/72 | HR 61 | Ht 61.0 in | Wt 182.7 lb

## 2013-10-05 DIAGNOSIS — Z954 Presence of other heart-valve replacement: Secondary | ICD-10-CM | POA: Diagnosis not present

## 2013-10-05 DIAGNOSIS — I10 Essential (primary) hypertension: Secondary | ICD-10-CM | POA: Diagnosis not present

## 2013-10-05 DIAGNOSIS — I350 Nonrheumatic aortic (valve) stenosis: Secondary | ICD-10-CM

## 2013-10-05 DIAGNOSIS — I359 Nonrheumatic aortic valve disorder, unspecified: Secondary | ICD-10-CM | POA: Diagnosis not present

## 2013-10-05 DIAGNOSIS — E789 Disorder of lipoprotein metabolism, unspecified: Secondary | ICD-10-CM

## 2013-10-05 DIAGNOSIS — E7800 Pure hypercholesterolemia, unspecified: Secondary | ICD-10-CM

## 2013-10-05 DIAGNOSIS — I209 Angina pectoris, unspecified: Secondary | ICD-10-CM

## 2013-10-05 DIAGNOSIS — E119 Type 2 diabetes mellitus without complications: Secondary | ICD-10-CM

## 2013-10-05 DIAGNOSIS — Z952 Presence of prosthetic heart valve: Secondary | ICD-10-CM

## 2013-10-05 DIAGNOSIS — Z7189 Other specified counseling: Secondary | ICD-10-CM

## 2013-10-05 DIAGNOSIS — I25119 Atherosclerotic heart disease of native coronary artery with unspecified angina pectoris: Secondary | ICD-10-CM

## 2013-10-05 DIAGNOSIS — I2510 Atherosclerotic heart disease of native coronary artery without angina pectoris: Secondary | ICD-10-CM

## 2013-10-05 MED ORDER — METFORMIN HCL 1000 MG PO TABS: 1000.0000 mg | ORAL_TABLET | Freq: Two times a day (BID) | ORAL | Status: DC

## 2013-10-05 MED ORDER — CLOPIDOGREL BISULFATE 75 MG PO TABS: 75.0000 mg | ORAL_TABLET | Freq: Every day | ORAL | Status: DC

## 2013-10-05 NOTE — Patient Instructions (Signed)
Continue your current therapy  Work on eating less fat and more vegetables. Cook with olive oil instead of fat.  I will see you in 3 months with fasting lab work.

## 2013-10-05 NOTE — Progress Notes (Signed)
[name] [name] Date of Birth: [birth year] Medical Record #[id number]  History of Present Illness: [name] [name] is seen back today for a transition of care visit.  She has known CAD with past CABG x 1 at the time of AVR back in 2012. Her other issues include HLD, HTN, DM and OA.  She was recently admitted from 09/24/13-09/25/13 for evaluation of chest pain. She states she has intermittent pain in the upper anterior chest. This usually goes away on its own but on the day of hospitalization it did not go away. She ruled out for MI. A Lexiscan myoview study was normal with EF 57%. Her DM was poorly controlled with A1C of 8.5%. Lipids were poorly controlled and pravastatin was increased to 40 mg daily. In the past she has been intolerant of higher doses of statins but so far is tolerating it well. She denies any recurrent chest pain but does have a nonproductive cough. No fever. Her diet is very poor and she continues to eat a high fat diet.   Current Outpatient Prescriptions  Medication Sig Dispense Refill  . clopidogrel (PLAVIX) 75 MG tablet Take 1 tablet (75 mg total) by mouth daily.  90 tablet  3  . ibuprofen (ADVIL,MOTRIN) 200 MG tablet Take 400 mg by mouth every 6 (six) hours as needed for headache.      . metFORMIN (GLUCOPHAGE) 1000 MG tablet Take 1 tablet (1,000 mg total) by mouth 2 (two) times daily with a meal.  180 tablet  3  . metoprolol tartrate (LOPRESSOR) 25 MG tablet Take 1 tablet (25 mg total) by mouth 2 (two) times daily. PATIENT NEEDS OFFICE VISIT FOR ADDITIONAL REFILLS  60 tablet  0  . pravastatin (PRAVACHOL) 40 MG tablet Take 1 tablet (40 mg total) by mouth every evening.  30 tablet  6  . valsartan-hydrochlorothiazide (DIOVAN-HCT) 320-25 MG per tablet Take 1 tablet by mouth daily.       No current facility-administered medications for this visit.    Allergies  Allergen Reactions  . Crestor [Rosuvastatin Calcium] Other (See Comments)    Breathing issues and chest pain  .  Lipitor [Atorvastatin Calcium] Other (See Comments)    Breathing issues and chest pain  . Norvasc [Amlodipine Besylate] Swelling  . Aspirin Other (See Comments)    unknown    Past Medical History  Diagnosis Date  . Diabetes mellitus   . Hypertension   . Aortic stenosis     s/p AVR August 2012  . Hypercholesterolemia   . OA (osteoarthritis)   . CAD (coronary artery disease)     s/p CABG x 15 August 2010  . Hyperlipidemia   . Degenerative arthritis of knee   . Obesity     Past Surgical History  Procedure Laterality Date  . Cardiac catheterization  07/25/2010    Dr Swaziland  . Coronary artery bypass graft  08/17/10    x1, Dr Laneta Simmers with LIMA to LAD  . Aortic valve replacement  08/17/2010    using a 21-mm Edwards pericardial Magna - Ease valve.   . Back surgery    . Total knee arthroplasty Right 08/26/2012    Dr Eulah Pont  . Total knee arthroplasty Right 08/26/2012    Procedure: TOTAL KNEE ARTHROPLASTY;  Surgeon: Loreta Ave, MD;  Location: [facility];  Service: Orthopedics;  Laterality: Right;    History  Smoking status  . Never Smoker   Smokeless tobacco  . Never Used    History  Alcohol Use  .  Yes    Comment: rarely    Family History  Problem Relation Age of Onset  . Tuberculosis Mother   . Heart disease Father     Review of Systems: The review of systems is per the HPI.  All other systems were reviewed and are negative.  Physical Exam: BP 166/72  Pulse 61  Ht  (1.549 m)  Wt 182 lb 11.2 oz (82.872 kg)  BMI 34.54 kg/m2 Patient is very pleasant and in no acute distress. Skin is warm and dry. Color is normal.  HEENT is unremarkable. Normocephalic/atraumatic. PERRL. Sclera are nonicteric. Neck is supple. No masses. No JVD. Lungs are clear. Cardiac exam shows a regular rate and rhythm. Soft outflow murmur noted. Abdomen is soft. Right knee with swelling. Incision looks ok. Gait and ROM are intact.  No gross neurologic deficits noted.  LABORATORY DATA:  PENDING  Lab Results  Component Value Date   WBC 6.4 09/25/2013   HGB 10.2* 09/25/2013   HCT 32.7* 09/25/2013   PLT 241 09/25/2013   GLUCOSE 155* 09/25/2013   CHOL 251* 09/25/2013   TRIG 118 09/25/2013   HDL 77 09/25/2013   LDLDIRECT 169.9 09/28/2012   LDLCALC 150* 09/25/2013   ALT 11 09/25/2013   AST 17 09/25/2013   NA 137 09/25/2013   K 4.3 09/25/2013   CL 102 09/25/2013   CREATININE 0.89 09/25/2013   BUN 15 09/25/2013   CO2 24 09/25/2013   TSH 2.810 09/24/2013   INR 1.15 09/24/2013   HGBA1C 8.5* 09/24/2013   Lab Results  Component Value Date   TROPONINI <0.30 09/25/2013    Ecg: NSR with nonspecific TWA   Assessment / Plan: 1. CAD - past CABG x 1 with AVR back in 2012 - negative Myoview - no more chest pain - would favor medical management.   2. HLD - Appears to be tolerating higher Pravastatin dose so far. Recommended dietary modification. Will recheck lipids in 3 months.   3. DM poorly controlled. Metformin renewed. Needs close follow up with primary care.   4. AVR - last echo was in 2012

## 2013-10-13 ENCOUNTER — Telehealth (HOSPITAL_COMMUNITY): Payer: Self-pay | Admitting: *Deleted

## 2013-10-18 ENCOUNTER — Ambulatory Visit (INDEPENDENT_AMBULATORY_CARE_PROVIDER_SITE_OTHER): Payer: Medicare Other | Admitting: Family Medicine

## 2013-10-18 VITALS — BP 142/80 | HR 93 | Temp 97.8°F | Resp 16 | Ht 60.25 in | Wt 184.8 lb

## 2013-10-18 DIAGNOSIS — E119 Type 2 diabetes mellitus without complications: Secondary | ICD-10-CM

## 2013-10-18 DIAGNOSIS — E789 Disorder of lipoprotein metabolism, unspecified: Secondary | ICD-10-CM

## 2013-10-18 DIAGNOSIS — E1310 Other specified diabetes mellitus with ketoacidosis without coma: Secondary | ICD-10-CM

## 2013-10-18 DIAGNOSIS — E785 Hyperlipidemia, unspecified: Secondary | ICD-10-CM | POA: Diagnosis not present

## 2013-10-18 DIAGNOSIS — I2510 Atherosclerotic heart disease of native coronary artery without angina pectoris: Secondary | ICD-10-CM | POA: Diagnosis not present

## 2013-10-18 DIAGNOSIS — Z1211 Encounter for screening for malignant neoplasm of colon: Secondary | ICD-10-CM | POA: Diagnosis not present

## 2013-10-18 DIAGNOSIS — R05 Cough: Secondary | ICD-10-CM

## 2013-10-18 DIAGNOSIS — D509 Iron deficiency anemia, unspecified: Secondary | ICD-10-CM

## 2013-10-18 DIAGNOSIS — R059 Cough, unspecified: Secondary | ICD-10-CM

## 2013-10-18 DIAGNOSIS — Z7189 Other specified counseling: Secondary | ICD-10-CM

## 2013-10-18 DIAGNOSIS — I10 Essential (primary) hypertension: Secondary | ICD-10-CM

## 2013-10-18 DIAGNOSIS — E1110 Type 2 diabetes mellitus with ketoacidosis without coma: Secondary | ICD-10-CM

## 2013-10-18 LAB — POCT CBC
Granulocyte percent: 78.9 %G (ref 37–80)
HCT, POC: 33.8 % — AB (ref 37.7–47.9)
Hemoglobin: 10.3 g/dL — AB (ref 12.2–16.2)
Lymph, poc: 1.3 (ref 0.6–3.4)
MCH: 24.1 pg — AB (ref 27–31.2)
MCHC: 30.6 g/dL — AB (ref 31.8–35.4)
MCV: 78.7 fL — AB (ref 80–97)
MID (cbc): 0.4 (ref 0–0.9)
MPV: 8.4 fL (ref 0–99.8)
PLATELET COUNT, POC: 216 10*3/uL (ref 142–424)
POC Granulocyte: 6.5 (ref 2–6.9)
POC LYMPH %: 16.2 % (ref 10–50)
POC MID %: 4.9 % (ref 0–12)
RBC: 4.29 M/uL (ref 4.04–5.48)
RDW, POC: 18.5 %
WBC: 8.2 10*3/uL (ref 4.6–10.2)

## 2013-10-18 LAB — POCT GLYCOSYLATED HEMOGLOBIN (HGB A1C): Hemoglobin A1C: 7.5

## 2013-10-18 LAB — GLUCOSE, POCT (MANUAL RESULT ENTRY): POC Glucose: 267 mg/dl — AB (ref 70–99)

## 2013-10-18 MED ORDER — METOPROLOL TARTRATE 25 MG PO TABS: 25.0000 mg | ORAL_TABLET | Freq: Two times a day (BID) | ORAL | Status: DC

## 2013-10-18 MED ORDER — PRAVASTATIN SODIUM 40 MG PO TABS: 40.0000 mg | ORAL_TABLET | Freq: Every evening | ORAL | Status: DC

## 2013-10-18 MED ORDER — METFORMIN HCL 1000 MG PO TABS: 1000.0000 mg | ORAL_TABLET | Freq: Two times a day (BID) | ORAL | Status: AC

## 2013-10-18 MED ORDER — BENZONATATE 100 MG PO CAPS: ORAL_CAPSULE | ORAL | Status: DC

## 2013-10-18 MED ORDER — VALSARTAN-HYDROCHLOROTHIAZIDE 320-25 MG PO TABS: 1.0000 | ORAL_TABLET | Freq: Every day | ORAL | Status: DC

## 2013-10-18 NOTE — Progress Notes (Signed)
Subjective: 79 year old lady previously known to me who is here for a checkup. She is recently had a cough for the last couple of weeks, and though it is doing better it is still persisting some. She does not smoke. She apparently has been more fatigued of late. Also she has a little more difficult with her memory than she used to. She doesn't always remember to take her medications, but tries to. She has the pills sent out in the bottles. We discussed using a pill counter tray. She hasn't of major but doesn't check her sugars. She has not been to an eye doctor for a long time. She does see the cardiologist. She is in hospitalized a couple times last year or so with chest pains. She has had a previous valve replacement, and is doing fairly well from that. She lives alone, but has a couple of her 3 children are very nearby, and her sister accompanied her here today. The patient does drive still.  Objective: Pleasant alert lady in no major distress. Throat clear. Neck supple. Chest clear. Heart regular with murmur of aortic valvular in nature right upper sternal border. Grade 2-3/6. No ankle edema. Fundi and he was a little difficult to visualize well. Her pupils are fairly constricted. What I could see was normal.  Assessment: Type 2 diabetes mellitus ASHD Fatigue Mild memory loss  Plan: Check labs including glucose, A1c, Cmet and lipids  Will need refill her medications.  Results for orders placed in visit on 10/18/13  POCT CBC      Result Value Ref Range   WBC 8.2  4.6 - 10.2 K/uL   Lymph, poc 1.3  0.6 - 3.4   POC LYMPH PERCENT 16.2  10 - 50 %L   MID (cbc) 0.4  0 - 0.9   POC MID % 4.9  0 - 12 %M   POC Granulocyte 6.5  2 - 6.9   Granulocyte percent 78.9  37 - 80 %G   RBC 4.29  4.04 - 5.48 M/uL   Hemoglobin 10.3 (*) 12.2 - 16.2 g/dL   HCT, POC 16.133.8 (*) 09.637.7 - 47.9 %   MCV 78.7 (*) 80 - 97 fL   MCH, POC 24.1 (*) 27 - 31.2 pg   MCHC 30.6 (*) 31.8 - 35.4 g/dL   RDW, POC 04.518.5     Platelet Count, POC 216.0  142 - 424 K/uL   MPV 8.4  0 - 99.8 fL  GLUCOSE, POCT (MANUAL RESULT ENTRY)      Result Value Ref Range   POC Glucose 267 (*) 70 - 99 mg/dl  POCT GLYCOSYLATED HEMOGLOBIN (HGB A1C)      Result Value Ref Range   Hemoglobin A1C 7.5     Assessment: Diabetes Anemia Hypertension Heart disease Fatigue  Plan: The anemia may be adding to things she needs to get back on her medicines check stools for blood. Did not send her for a colonoscopy at her age, we will see what the stools look like first. If they're positive she'll need a colonoscopy. Return in 3 months for recheck, sooner if any problems. Stressed the need for taking her medications faithfully.

## 2013-10-18 NOTE — Patient Instructions (Addendum)
Continue taking naproxen at one daily for cholesterol  Continue taking the metoprolol and valsartan/HCT for the heart and blood pressure  Continue taking the metformin twice daily for diabetes  Try and get some regular exercise  Continue taking the clopedadril (plavix) for that blood thinner for your heart.  Check the stool specimens on 3 separate bowel movements and returned into the office so we can check for chemically detectable blood.  If you see blood in your stool at anytime please come in  After you have submitted the stool specimens to begin taking over-the-counter iron one daily. You can ask the pharmacist for a good month.  Plan to return in January to followup on your anemia and diabetes

## 2013-10-19 LAB — COMPREHENSIVE METABOLIC PANEL
ALBUMIN: 3.4 g/dL — AB (ref 3.5–5.2)
ALK PHOS: 89 U/L (ref 39–117)
ALT: 9 U/L (ref 0–35)
AST: 14 U/L (ref 0–37)
BUN: 15 mg/dL (ref 6–23)
CHLORIDE: 103 meq/L (ref 96–112)
CO2: 30 mEq/L (ref 19–32)
Calcium: 8.8 mg/dL (ref 8.4–10.5)
Creat: 1.02 mg/dL (ref 0.50–1.10)
Glucose, Bld: 269 mg/dL — ABNORMAL HIGH (ref 70–99)
Potassium: 5 mEq/L (ref 3.5–5.3)
SODIUM: 140 meq/L (ref 135–145)
TOTAL PROTEIN: 6.5 g/dL (ref 6.0–8.3)
Total Bilirubin: 0.3 mg/dL (ref 0.2–1.2)

## 2013-10-19 LAB — IRON AND TIBC
%SAT: 3 % — AB (ref 20–55)
Iron: 10 ug/dL — ABNORMAL LOW (ref 42–145)
TIBC: 312 ug/dL (ref 250–470)
UIBC: 302 ug/dL (ref 125–400)

## 2013-10-19 LAB — LIPID PANEL
CHOL/HDL RATIO: 3.4 ratio
Cholesterol: 255 mg/dL — ABNORMAL HIGH (ref 0–200)
HDL: 76 mg/dL (ref >39)
LDL CALC: 156 mg/dL — AB (ref 0–99)
Triglycerides: 114 mg/dL (ref <150)
VLDL: 23 mg/dL (ref 0–40)

## 2013-10-21 ENCOUNTER — Other Ambulatory Visit: Payer: Self-pay | Admitting: *Deleted

## 2013-10-21 MED ORDER — PRAVASTATIN SODIUM 80 MG PO TABS: 80.0000 mg | ORAL_TABLET | Freq: Every day | ORAL | Status: AC

## 2013-10-25 LAB — IFOBT (OCCULT BLOOD): IFOBT: NEGATIVE

## 2013-10-25 NOTE — Addendum Note (Signed)
Addended by: Gerrianne ScalePAYNE, Lanna Labella L on: 10/25/2013 03:59 PM   Modules accepted: Orders

## 2013-11-01 LAB — IFOBT (OCCULT BLOOD)
IFOBT: NEGATIVE
IMMUNOLOGICAL FECAL OCCULT BLOOD TEST: NEGATIVE

## 2013-11-01 NOTE — Addendum Note (Signed)
Addended by: Gerrianne ScalePAYNE, Davey Bergsma L on: 11/01/2013 04:25 PM   Modules accepted: Orders

## 2013-11-16 DIAGNOSIS — M1711 Unilateral primary osteoarthritis, right knee: Secondary | ICD-10-CM | POA: Diagnosis not present

## 2013-12-23 ENCOUNTER — Telehealth: Payer: Self-pay | Admitting: Family Medicine

## 2013-12-23 NOTE — Telephone Encounter (Signed)
Left a message for patient to return call about flu shot.

## 2014-01-11 ENCOUNTER — Ambulatory Visit: Payer: Medicare Other | Admitting: Cardiology

## 2014-01-11 ENCOUNTER — Telehealth: Payer: Self-pay | Admitting: Cardiology

## 2014-01-11 NOTE — Telephone Encounter (Signed)
[name] ForestShirley called in stating that she had to cancel the pt's appt because she told her about it today and the sister has to work. I informed the sister that Dr. SwazilandJordan would not have another available appt until April. She wanted to know if Dr. SwazilandJordan could fit [name] [name] in on the 1/15 when she had time off of work. Please call  Thanks

## 2014-01-11 NOTE — Telephone Encounter (Signed)
Returned call to patient's sister Talbert ForestShirley no answer.LMTC.

## 2014-01-11 NOTE — Telephone Encounter (Signed)
Returned call to patient's sister Talbert ForestShirley phone rings busy.

## 2014-01-12 ENCOUNTER — Ambulatory Visit: Payer: Medicare Other | Admitting: Cardiology

## 2014-01-12 NOTE — Telephone Encounter (Signed)
Patient's sister Talbert ForestShirley returned call yesterday 01/11/14 [facility] and she rescheduled patient's appointment with Dr.Jordan to 01/28/14 at 11:30 am.

## 2014-01-28 ENCOUNTER — Encounter: Payer: Self-pay | Admitting: Cardiology

## 2014-01-28 ENCOUNTER — Ambulatory Visit (INDEPENDENT_AMBULATORY_CARE_PROVIDER_SITE_OTHER): Payer: Medicare Other | Admitting: Cardiology

## 2014-01-28 VITALS — BP 180/79 | HR 70 | Ht 61.0 in | Wt 189.2 lb

## 2014-01-28 DIAGNOSIS — Z952 Presence of prosthetic heart valve: Secondary | ICD-10-CM

## 2014-01-28 DIAGNOSIS — E7800 Pure hypercholesterolemia, unspecified: Secondary | ICD-10-CM

## 2014-01-28 DIAGNOSIS — I25119 Atherosclerotic heart disease of native coronary artery with unspecified angina pectoris: Secondary | ICD-10-CM | POA: Diagnosis not present

## 2014-01-28 DIAGNOSIS — I10 Essential (primary) hypertension: Secondary | ICD-10-CM

## 2014-01-28 DIAGNOSIS — I350 Nonrheumatic aortic (valve) stenosis: Secondary | ICD-10-CM

## 2014-01-28 DIAGNOSIS — Z954 Presence of other heart-valve replacement: Secondary | ICD-10-CM

## 2014-01-28 NOTE — Patient Instructions (Signed)
You need to take your medications as prescribed.  You need to eat a healthy diet with more vegetables ( but not seasoned with salt or fat)  You need to increase your aerobic activity with walking  I will see you in 6 months.

## 2014-01-28 NOTE — Progress Notes (Signed)
[name] HooseYolanda C [name] Date of Birth: 12/06/1934 Medical Record #[id number]  History of Present Illness: [name] [name] is seen for follow up of CAD and AVR.  She has known CAD with past CABG x 1 at the time of AVR back in 2012. Her other issues include HLD, HTN, DM and OA.  She was admitted from 09/24/13-09/25/13 for evaluation of chest pain.She ruled out for MI. A Lexiscan myoview study was normal with EF 57%.  In the past she has been intolerant of higher doses of statins but so far is tolerating it well. She denies any recurrent chest pain or SOB.  Her diet remains very poor and she continues to eat a high fat and sodiium diet. Her daughter is with her today and reports she often skips her medication. She did not take her medication yesterday or today. She does not exercise.   Current Outpatient Prescriptions  Medication Sig Dispense Refill  . clopidogrel (PLAVIX) 75 MG tablet Take 1 tablet (75 mg total) by mouth daily. 90 tablet 3  . ibuprofen (ADVIL,MOTRIN) 200 MG tablet Take 400 mg by mouth every 6 (six) hours as needed for headache.    . metFORMIN (GLUCOPHAGE) 1000 MG tablet Take 1 tablet (1,000 mg total) by mouth 2 (two) times daily with a meal. 180 tablet 3  . metoprolol tartrate (LOPRESSOR) 25 MG tablet Take 1 tablet (25 mg total) by mouth 2 (two) times daily. PATIENT NEEDS OFFICE VISIT FOR ADDITIONAL REFILLS 180 tablet 3  . pravastatin (PRAVACHOL) 80 MG tablet Take 1 tablet (80 mg total) by mouth daily. 30 tablet 3  . valsartan-hydrochlorothiazide (DIOVAN-HCT) 320-25 MG per tablet Take 1 tablet by mouth daily. 90 tablet 3   No current facility-administered medications for this visit.    Allergies  Allergen Reactions  . Crestor [Rosuvastatin Calcium] Other (See Comments)    Breathing issues and chest pain  . Lipitor [Atorvastatin Calcium] Other (See Comments)    Breathing issues and chest pain  . Norvasc [Amlodipine Besylate] Swelling  . Aspirin Other (See Comments)    unknown     Past Medical History  Diagnosis Date  . Diabetes mellitus   . Hypertension   . Aortic stenosis     s/p AVR August 2012  . Hypercholesterolemia   . OA (osteoarthritis)   . CAD (coronary artery disease)     s/p CABG x 15 August 2010  . Hyperlipidemia   . Degenerative arthritis of knee   . Obesity     Past Surgical History  Procedure Laterality Date  . Cardiac catheterization  07/25/2010    Dr SwazilandJordan  . Coronary artery bypass graft  08/17/10    x1, Dr Laneta SimmersBartle with LIMA to LAD  . Aortic valve replacement  08/17/2010    using a 21-mm Edwards pericardial Magna - Ease valve.   . Back surgery    . Total knee arthroplasty Right 08/26/2012    Dr Eulah PontMurphy  . Total knee arthroplasty Right 08/26/2012    Procedure: TOTAL KNEE ARTHROPLASTY;  Surgeon: Loreta Aveaniel F Murphy, MD;  Location[facility] OR;  Service: Orthopedics;  Laterality: Right;    History  Smoking status  . Never Smoker   Smokeless tobacco  . Never Used    History  Alcohol Use  . Yes    Comment: rarely    Family History  Problem Relation Age of Onset  . Tuberculosis Mother   . Heart disease Father     Review of Systems: The review of systems is  per the HPI.  All other systems were reviewed and are negative.  Physical Exam: BP 180/79 mmHg  Pulse 70  Ht  (1.549 m)  Wt 189 lb 3.2 oz (85.821 kg)  BMI 35.77 kg/m2 Patient is very pleasant and in no acute distress. Skin is warm and dry. Color is normal.  HEENT is unremarkable. Normocephalic/atraumatic. PERRL. Sclera are nonicteric. Neck is supple. No masses. No JVD. Lungs are clear. Cardiac exam shows a regular rate and rhythm. Soft outflow murmur noted. Abdomen is soft. Right knee with swelling. Incision looks ok. Gait and ROM are intact.  No gross neurologic deficits noted.  LABORATORY DATA: PENDING  Lab Results  Component Value Date   WBC 8.2 10/18/2013   HGB 10.3* 10/18/2013   HCT 33.8* 10/18/2013   PLT 241 09/25/2013   GLUCOSE 269* 10/18/2013   CHOL 255*  10/18/2013   TRIG 114 10/18/2013   HDL 76 10/18/2013   LDLDIRECT 169.9 09/28/2012   LDLCALC 156* 10/18/2013   ALT 9 10/18/2013   AST 14 10/18/2013   NA 140 10/18/2013   K 5.0 10/18/2013   CL 103 10/18/2013   CREATININE 1.02 10/18/2013   BUN 15 10/18/2013   CO2 30 10/18/2013   TSH 2.810 09/24/2013   INR 1.15 09/24/2013   HGBA1C 7.5 10/18/2013   Lab Results  Component Value Date   TROPONINI <0.30 09/25/2013       Assessment / Plan: 1. CAD - past CABG x 1 with AVR back in 2012 - negative Myoview in Sept. 2015. - no more chest pain - favor medical management.   2. HLD - Appears to be tolerating higher Pravastatin dose so far. Recommended dietary modification with heart healthy diet.   3. DM poorly controlled. On metformin. I am sure this is not well controlled given poor dietary and medication compliance.  4. AVR - last echo was in 2012   5. HTN poorly controlled due to medication noncompliance. Stressed importance of taking medication and dietary modification.

## 2014-04-16 IMAGING — CR DG CHEST 2V
2 series · 2 of 2 positions shown · non-contrast
Comparison: Two-view chest x-ray 08/26/2012, 09/11/2010,
07/24/2010.

CLINICAL DATA: Chest pain.  Shortness of breath.  Right knee
arthroplasty on 08/26/2012.

CHEST - 2 VIEW

[w chest pa]
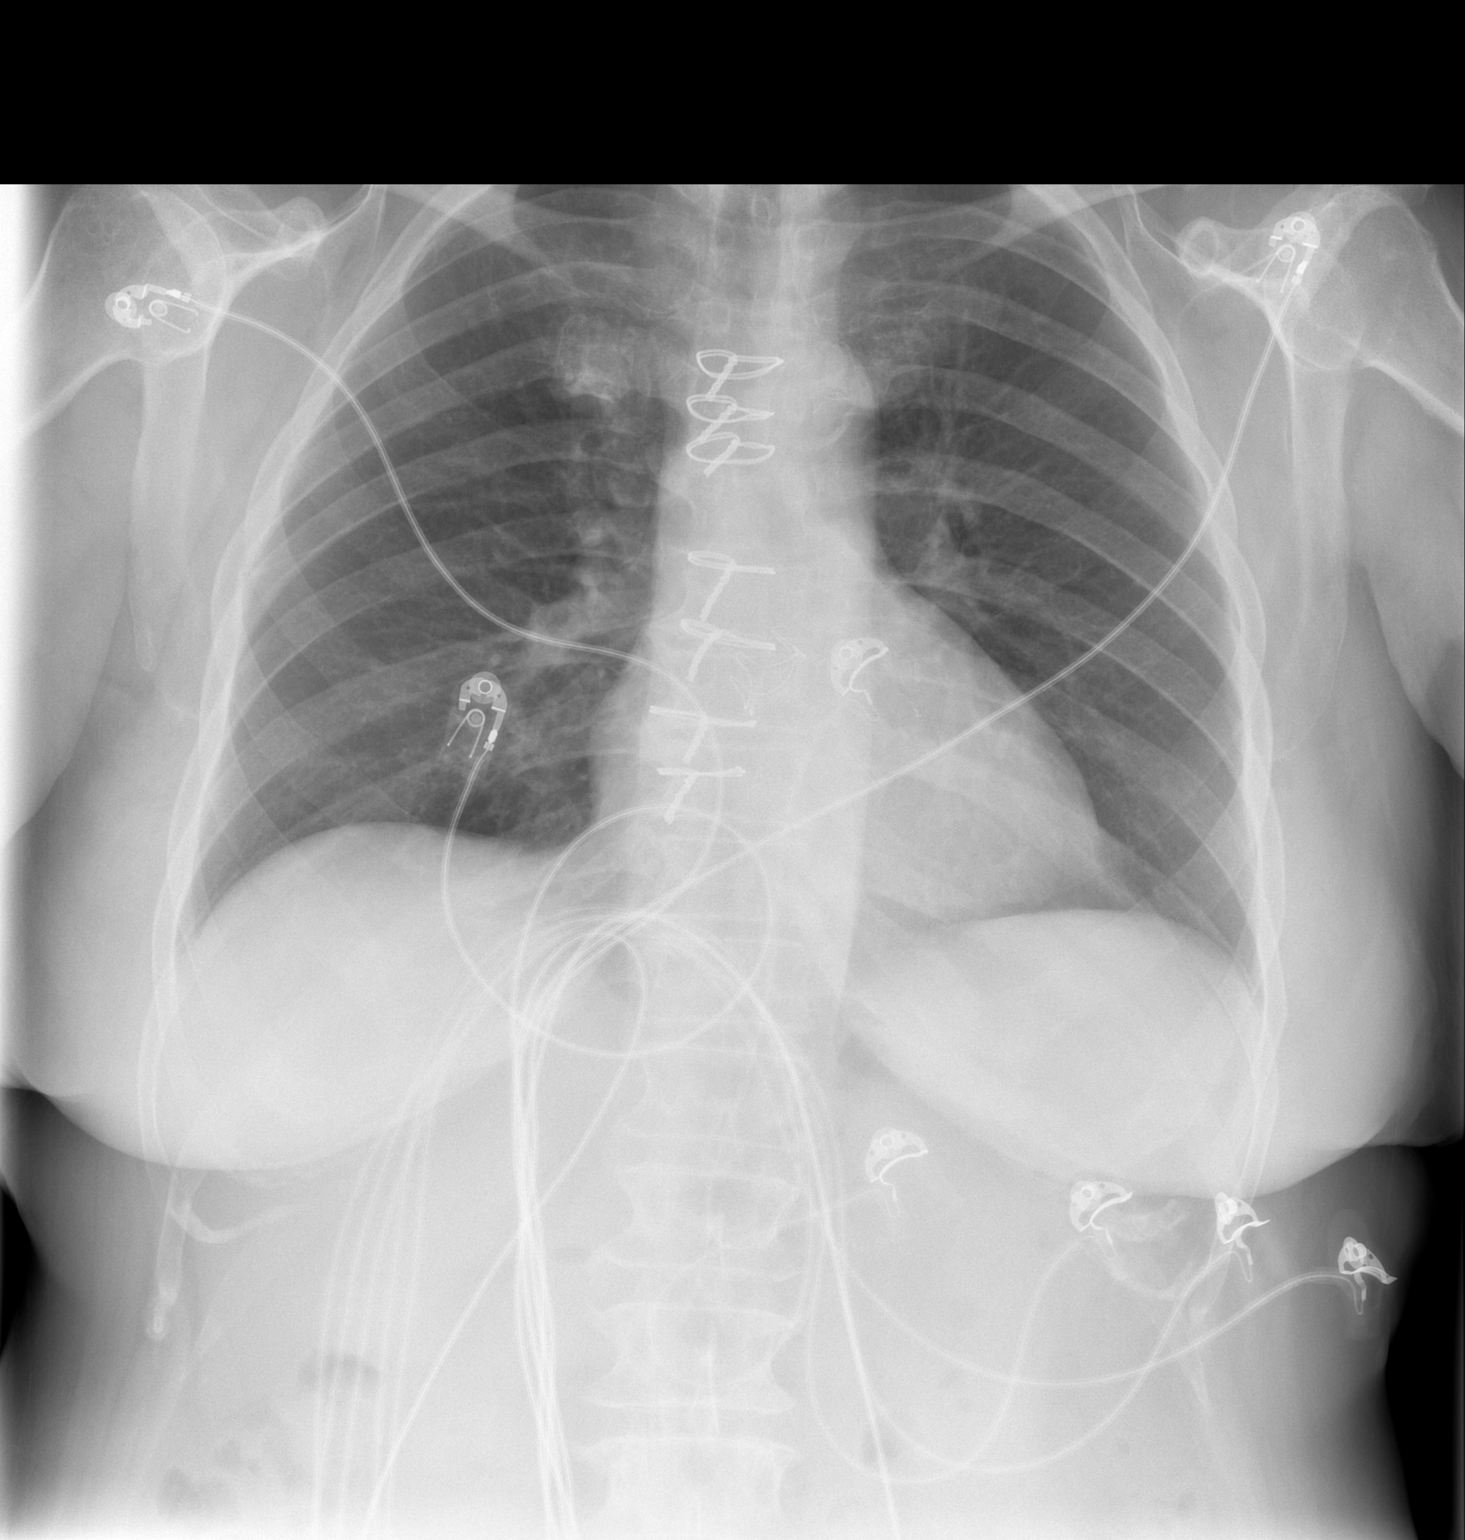

[w chest lat]
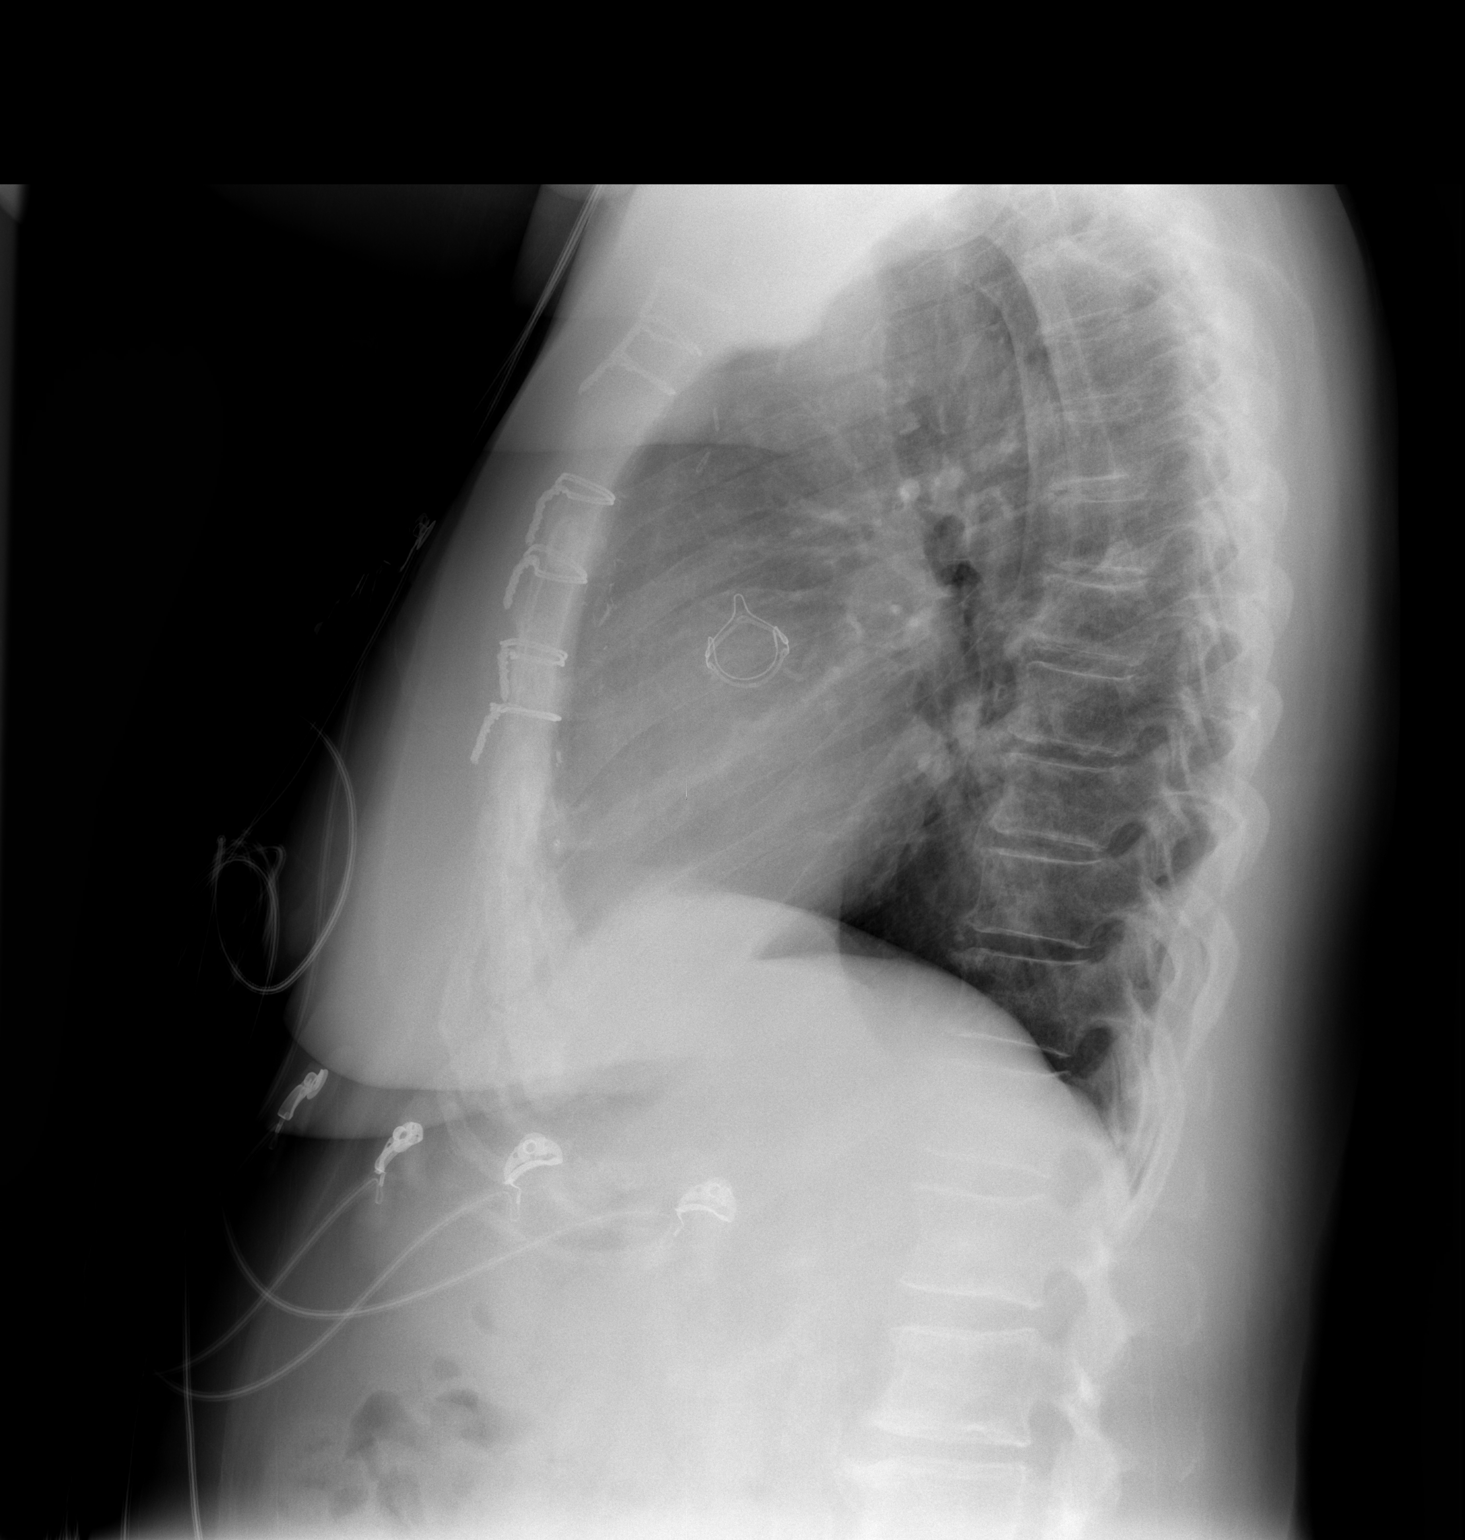

[2 of 2 positions shown; findings below may reference images not displayed]

FINDINGS: Prior sternotomy for CABG and aortic valve replacement.
Cardiac silhouette normal in size, unchanged.  Thoracic aorta
atherosclerotic, unchanged.  Hilar and mediastinal contours
otherwise unremarkable.  Lungs clear.  Bronchovascular markings
normal.  Pulmonary vascularity normal.  No pneumothorax.  No
pleural effusions.  Degenerative changes involving the thoracic
spine.  No significant interval change.
IMPRESSION: No acute cardiopulmonary disease.  Stable examination.

## 2014-11-03 ENCOUNTER — Encounter: Payer: Medicare Other | Admitting: Family Medicine

## 2014-11-03 DIAGNOSIS — E669 Obesity, unspecified: Secondary | ICD-10-CM | POA: Insufficient documentation

## 2014-11-03 NOTE — Progress Notes (Deleted)
   Subjective:    Patient ID: [name] [name], female    DOB: [birth year], 80 y.o.   MRN: [id number]  HPI   Pt is sched for a AWV today.  She has last seen in our clinic 1 yr ago - 10/18/13 - by my colleague Dr. Alwyn RenHopper.  I have seen pt once prior - over 2 years ago for f/u on her chornic medical problems.  Therefore, she had to many acute issues today to give us inadequate time to complete both visits so she will need to resched for her AWV in 3-4 mos.  At her last visit, pt was c/o a protracted cough, fatigue, and memory loss. She had noted problems with medication compliance due to her memory. She lives a lone but has 3 grown children in town. Pt driving still.  Her sister Talbert ForestShirley also  CAD/AVR:Pt is seen by cardiology - Dr. SwazilandJordan at [facility]. Was hosp o/n 1 yr ago for CP and is s/p aortic valve replacement due to aortic stenosis and has known CAD with past CABG x 1 in 2012.  Last seen at Cardiology 9 mos prior Poor diet - high fat and sodium.  Poor medication compliance, very sedentary. Neg myoview in 09/2013 so now rec medical management. Last echo 2012.  HTN: BP labile  HPL: h/o statin intolerance at higher doses.   Last LDL 170 direct (156 calc).  Pravastatin was increased to 80mg  1 yr prior - has not had lfts rechecked since.  DM: Pt has type 2 DM but does not check her cbgs, no optho. At her last OV gluc was 269 with a1c of 7.5  OA:   Anemia: Found to be iron deficient last yr - iron 10 - started on daily iron replacement - bid x 1 mo, then qd x 3 mos.  Neg HOC at that time  Review of Systems     Objective:   Physical Exam        Assessment & Plan:  - send home wht repeat HOC as long as pt would be willing to f/u w/ colonoscopy/GI if +

## 2014-12-14 NOTE — Progress Notes (Signed)
This encounter was created in error - please disregard.

## 2015-04-11 ENCOUNTER — Ambulatory Visit: Payer: Medicare Other | Admitting: Physician Assistant

## 2016-08-23 ENCOUNTER — Telehealth: Payer: Self-pay | Admitting: Physician Assistant

## 2016-08-23 ENCOUNTER — Encounter (HOSPITAL_COMMUNITY): Payer: Self-pay | Admitting: Emergency Medicine

## 2016-08-23 DIAGNOSIS — M79661 Pain in right lower leg: Secondary | ICD-10-CM | POA: Diagnosis not present

## 2016-08-23 DIAGNOSIS — R2243 Localized swelling, mass and lump, lower limb, bilateral: Secondary | ICD-10-CM | POA: Diagnosis present

## 2016-08-23 DIAGNOSIS — I10 Essential (primary) hypertension: Secondary | ICD-10-CM | POA: Diagnosis not present

## 2016-08-23 DIAGNOSIS — Z7902 Long term (current) use of antithrombotics/antiplatelets: Secondary | ICD-10-CM | POA: Diagnosis not present

## 2016-08-23 DIAGNOSIS — I2510 Atherosclerotic heart disease of native coronary artery without angina pectoris: Secondary | ICD-10-CM | POA: Diagnosis not present

## 2016-08-23 DIAGNOSIS — M79605 Pain in left leg: Secondary | ICD-10-CM | POA: Insufficient documentation

## 2016-08-23 DIAGNOSIS — E119 Type 2 diabetes mellitus without complications: Secondary | ICD-10-CM | POA: Diagnosis not present

## 2016-08-23 DIAGNOSIS — Z79899 Other long term (current) drug therapy: Secondary | ICD-10-CM | POA: Insufficient documentation

## 2016-08-23 DIAGNOSIS — Z7984 Long term (current) use of oral hypoglycemic drugs: Secondary | ICD-10-CM | POA: Diagnosis not present

## 2016-08-23 LAB — CBC WITH DIFFERENTIAL/PLATELET
Basophils Absolute: 0 10*3/uL (ref 0.0–0.1)
Basophils Relative: 0 %
EOS ABS: 0.1 10*3/uL (ref 0.0–0.7)
EOS PCT: 2 %
HEMATOCRIT: 38.2 % (ref 36.0–46.0)
HEMOGLOBIN: 12.1 g/dL (ref 12.0–15.0)
LYMPHS ABS: 1.4 10*3/uL (ref 0.7–4.0)
Lymphocytes Relative: 29 %
MCH: 25.7 pg — ABNORMAL LOW (ref 26.0–34.0)
MCHC: 31.7 g/dL (ref 30.0–36.0)
MCV: 81.3 fL (ref 78.0–100.0)
MONOS PCT: 6 %
Monocytes Absolute: 0.3 10*3/uL (ref 0.1–1.0)
NEUTROS PCT: 63 %
Neutro Abs: 2.9 10*3/uL (ref 1.7–7.7)
Platelets: 211 10*3/uL (ref 150–400)
RBC: 4.7 MIL/uL (ref 3.87–5.11)
RDW: 14.9 % (ref 11.5–15.5)
WBC: 4.6 10*3/uL (ref 4.0–10.5)

## 2016-08-23 LAB — COMPREHENSIVE METABOLIC PANEL
ALBUMIN: 3.2 g/dL — AB (ref 3.5–5.0)
ALK PHOS: 83 U/L (ref 38–126)
ALT: 15 U/L (ref 14–54)
AST: 23 U/L (ref 15–41)
Anion gap: 7 (ref 5–15)
BILIRUBIN TOTAL: 0.3 mg/dL (ref 0.3–1.2)
BUN: 14 mg/dL (ref 6–20)
CALCIUM: 8.4 mg/dL — AB (ref 8.9–10.3)
CO2: 25 mmol/L (ref 22–32)
CREATININE: 1.01 mg/dL — AB (ref 0.44–1.00)
Chloride: 105 mmol/L (ref 101–111)
GFR calc Af Amer: 60 mL/min — ABNORMAL LOW (ref >60)
GFR calc non Af Amer: 52 mL/min — ABNORMAL LOW (ref >60)
GLUCOSE: 182 mg/dL — AB (ref 65–99)
Potassium: 3.6 mmol/L (ref 3.5–5.1)
Sodium: 137 mmol/L (ref 135–145)
TOTAL PROTEIN: 6.2 g/dL — AB (ref 6.5–8.1)

## 2016-08-23 NOTE — Telephone Encounter (Signed)
mailbox is full on home # listed  Left message on cell

## 2016-08-23 NOTE — Telephone Encounter (Signed)
New message    Pt c/o swelling: STAT is pt has developed SOB within 24 hours  1. How long have you been experiencing swelling? Since having vascular testing   2. Where is the swelling located? Her legs and ankles  3.  Are you currently taking a "fluid pill"? no  4.  Are you currently SOB?  no  5.  Have you traveled recently? No  Her lower legs are turning blue color or dark black color

## 2016-08-23 NOTE — ED Triage Notes (Addendum)
Patient reports worsening lower legs swelling/edema more at right side for several weeks , denies chest pain or SOB , no fever or chills , hypertensive at triage 171/74 . Pt. Stated that she has not taken her antihypertensive medication regularly.

## 2016-08-23 NOTE — Telephone Encounter (Signed)
Left detailed message go to ER

## 2016-08-24 ENCOUNTER — Encounter (HOSPITAL_COMMUNITY): Payer: Self-pay | Admitting: Emergency Medicine

## 2016-08-24 ENCOUNTER — Ambulatory Visit (HOSPITAL_BASED_OUTPATIENT_CLINIC_OR_DEPARTMENT_OTHER)
Admission: RE | Admit: 2016-08-24 | Discharge: 2016-08-24 | Disposition: A | Discharge status: Home / Self Care | Payer: Medicare PPO | Source: Ambulatory Visit | Attending: Emergency Medicine | Admitting: Emergency Medicine

## 2016-08-24 ENCOUNTER — Emergency Department (HOSPITAL_COMMUNITY): Payer: Medicare PPO

## 2016-08-24 ENCOUNTER — Emergency Department (HOSPITAL_COMMUNITY)
Admission: EM | Admit: 2016-08-24 | Discharge: 2016-08-24 | Disposition: A | Discharge status: Home / Self Care | Payer: Medicare PPO | Attending: Emergency Medicine | Admitting: Emergency Medicine

## 2016-08-24 DIAGNOSIS — M79606 Pain in leg, unspecified: Secondary | ICD-10-CM

## 2016-08-24 DIAGNOSIS — R609 Edema, unspecified: Secondary | ICD-10-CM | POA: Diagnosis not present

## 2016-08-24 DIAGNOSIS — M7989 Other specified soft tissue disorders: Secondary | ICD-10-CM | POA: Insufficient documentation

## 2016-08-24 DIAGNOSIS — M79604 Pain in right leg: Secondary | ICD-10-CM

## 2016-08-24 DIAGNOSIS — M79605 Pain in left leg: Secondary | ICD-10-CM

## 2016-08-24 DIAGNOSIS — I10 Essential (primary) hypertension: Secondary | ICD-10-CM

## 2016-08-24 MED ORDER — METOPROLOL TARTRATE 25 MG PO TABS
25.0000 mg | ORAL_TABLET | Freq: Once | ORAL | Status: AC
  Administered 2016-08-24: 25 mg via ORAL
  Filled 2016-08-24: qty 1

## 2016-08-24 NOTE — ED Notes (Signed)
Pt departed in NAD.  

## 2016-08-24 NOTE — ED Provider Notes (Signed)
[facility]-EMERGENCY DEPT Provider Note   CSN: [id number] Arrival date & time: 08/23/16  2106   By signing my name below, I, Clarisse Gouge, attest that this documentation has been prepared under the direction and in the presence of Yanitza Shvartsman, MD. Electronically signed, Clarisse Gouge, ED Scribe. 08/24/16. 2:02 AM.   History   Chief Complaint Chief Complaint  Patient presents with  . Lower legs Swelling  . Hypertension   The history is provided by the patient and medical records. No language interpreter was used.  Leg Pain   This is a chronic problem. The current episode started more than 1 week ago. The problem occurs every several days. The problem has not changed since onset.The pain is present in the right lower leg and left lower leg. The quality of the pain is described as aching. The patient is experiencing no pain. Pertinent negatives include no numbness, full range of motion, no stiffness, no tingling and no itching. She has tried nothing for the symptoms. The treatment provided no relief. There has been no history of extremity trauma. Family history is significant for no rheumatoid arthritis.    [name] [name] is a 82 y.o. female with h/o DM, HLD, HTN, OA, Aortic stenosis and CAD presenting to the Emergency Department concerning bilateral leg swelling gradually worsening x 1-2 months. Redness, dark discoloration, and pain associated. Pt found to have leakage of her valve following echocardiogram performed with PCP. Pt states she has not taken her HTN medications as prescribed regularly at home. Pt followed by Danella Maiers for cardiology. Vascular surgeon noted for F/U. Family states the pt is supposed to be taking a blood thinner though she does not. No chest pain, SOB, fever or chills. No other complaints at this time.  Past Medical History:  Diagnosis Date  . Aortic stenosis    s/p AVR August 2012  . CAD (coronary artery disease)    s/p CABG x 15 August 2010  . Degenerative  arthritis of knee   . Diabetes mellitus   . Hypercholesterolemia   . Hyperlipidemia   . Hypertension   . OA (osteoarthritis)   . Obesity     Patient Active Problem List   Diagnosis Date Noted  . Obesity 11/03/2014  . Unstable angina (HCC) 09/24/2013  . Type 2 diabetes mellitus with peripheral circulatory disorder (HCC) 10/12/2012  . Midsternal chest pain 09/07/2012  . Postoperative anemia due to acute blood loss 08/28/2012  . S/P AVR (aortic valve replacement) 09/19/2010  . CAD (coronary artery disease)   . OA (osteoarthritis)   . Hyperlipidemia   . Degenerative arthritis of knee   . Diabetes mellitus type II, non insulin dependent (HCC) 06/28/2010  . Hypertension   . Aortic stenosis     Past Surgical History:  Procedure Laterality Date  . AORTIC VALVE REPLACEMENT  08/17/2010   using a 21-mm Edwards pericardial Magna - Ease valve.   Marland Kitchen BACK SURGERY    . CARDIAC CATHETERIZATION  07/25/2010   Dr Swaziland  . CORONARY ARTERY BYPASS GRAFT  08/17/10   x1, Dr Laneta Simmers with LIMA to LAD  . TOTAL KNEE ARTHROPLASTY Right 08/26/2012   Dr Eulah Pont  . TOTAL KNEE ARTHROPLASTY Right 08/26/2012   Procedure: TOTAL KNEE ARTHROPLASTY;  Surgeon: Loreta Ave, MD;  Location: [facility] OR;  Service: Orthopedics;  Laterality: Right;    OB History    No data available       Home Medications    Prior to Admission medications  Medication Sig Start Date End Date Taking? Authorizing Provider  clopidogrel (PLAVIX) 75 MG tablet Take 1 tablet (75 mg total) by mouth daily. 10/05/13   Swaziland, Peter M, MD  ibuprofen (ADVIL,MOTRIN) 200 MG tablet Take 400 mg by mouth every 6 (six) hours as needed for headache.    [provider]  metFORMIN (GLUCOPHAGE) 1000 MG tablet Take 1 tablet (1,000 mg total) by mouth 2 (two) times daily with a meal. 10/18/13 12/06/14  Peyton Najjar, MD  metoprolol tartrate (LOPRESSOR) 25 MG tablet Take 1 tablet (25 mg total) by mouth 2 (two) times daily. PATIENT NEEDS OFFICE  VISIT FOR ADDITIONAL REFILLS 10/18/13   Peyton Najjar, MD  pravastatin (PRAVACHOL) 80 MG tablet Take 1 tablet (80 mg total) by mouth daily. 10/21/13   Peyton Najjar, MD  valsartan-hydrochlorothiazide (DIOVAN-HCT) 320-25 MG per tablet Take 1 tablet by mouth daily. 10/18/13   Peyton Najjar, MD    Family History Family History  Problem Relation Age of Onset  . Tuberculosis Mother   . Heart disease Father     Social History Social History  Substance Use Topics  . Smoking status: Never Smoker  . Smokeless tobacco: Never Used  . Alcohol use Yes     Comment: rarely     Allergies   Crestor [rosuvastatin calcium]; Lipitor [atorvastatin calcium]; Norvasc [amlodipine besylate]; and Aspirin   Review of Systems Review of Systems  Constitutional: Negative for fatigue.  Respiratory: Negative for shortness of breath and wheezing.   Cardiovascular: Positive for leg swelling. Negative for chest pain and palpitations.  Gastrointestinal: Negative for nausea and vomiting.  Musculoskeletal: Positive for myalgias. Negative for joint swelling and stiffness.  Skin: Positive for color change. Negative for itching and wound.  Neurological: Negative for tingling, weakness and numbness.  Hematological: Does not bruise/bleed easily.  All other systems reviewed and are negative.    Physical Exam Updated Vital Signs BP (!) 220/78 (BP Location: Left Arm)   Pulse 70   Temp 98.1 F (36.7 C) (Oral)   Resp 16   SpO2 100%   Physical Exam  Constitutional: She is oriented to person, place, and time. She appears well-developed and well-nourished. No distress.  HENT:  Head: Normocephalic and atraumatic.  Mouth/Throat: Oropharynx is clear and moist and mucous membranes are normal. No oropharyngeal exudate.  Eyes: Pupils are equal, round, and reactive to light. Conjunctivae are normal.  Neck: Normal range of motion. Neck supple. No JVD present. Carotid bruit is not present.  Cardiovascular: Normal  rate, regular rhythm, normal heart sounds and intact distal pulses.   Pulmonary/Chest: Effort normal and breath sounds normal. No stridor.  Abdominal: Soft. Bowel sounds are normal. She exhibits no fluid wave and no mass. There is no tenderness. There is no guarding.  Musculoskeletal: Normal range of motion. She exhibits no edema, tenderness or deformity.  Distal pulses intact, 2 + with intact warmth.  There is mild discoloration of the skin of B ankles.  No edema.  No cords, negative Homan's.  One lesion on mid left shin cw enodosum  Lymphadenopathy:    She has no cervical adenopathy.  Neurological: She is alert and oriented to person, place, and time. She displays normal reflexes.  Skin: Skin is warm and dry. Capillary refill takes less than 2 seconds. No erythema.  Psychiatric: She has a normal mood and affect.  Nursing note and vitals reviewed.    ED Treatments / Results   Vitals:   08/24/16 0200 08/24/16 0215  BP: (!) 201/75 (!) 198/69  Pulse: 66 70  Resp: 19 19  Temp:    SpO2: 100% 100%    DIAGNOSTIC STUDIES: Oxygen Saturation is 100% on RA, NL by my interpretation.    COORDINATION OF CARE: 1:50 AM-Discussed next steps with pt. Pt verbalized understanding and is agreeable with the plan. Will order CXr, refer to rheumatologist and refer for outpatient DVT study.   Labs (all labs ordered are listed, but only abnormal results are displayed) Results for orders placed or performed during the hospital encounter of 08/24/16  CBC with Differential  Result Value Ref Range   WBC 4.6 4.0 - 10.5 K/uL   RBC 4.70 3.87 - 5.11 MIL/uL   Hemoglobin 12.1 12.0 - 15.0 g/dL   HCT 16.1 09.6 - 04.5 %   MCV 81.3 78.0 - 100.0 fL   MCH 25.7 (L) 26.0 - 34.0 pg   MCHC 31.7 30.0 - 36.0 g/dL   RDW 40.9 81.1 - 91.4 %   Platelets 211 150 - 400 K/uL   Neutrophils Relative % 63 %   Neutro Abs 2.9 1.7 - 7.7 K/uL   Lymphocytes Relative 29 %   Lymphs Abs 1.4 0.7 - 4.0 K/uL   Monocytes Relative 6 %     Monocytes Absolute 0.3 0.1 - 1.0 K/uL   Eosinophils Relative 2 %   Eosinophils Absolute 0.1 0.0 - 0.7 K/uL   Basophils Relative 0 %   Basophils Absolute 0.0 0.0 - 0.1 K/uL  Comprehensive metabolic panel  Result Value Ref Range   Sodium 137 135 - 145 mmol/L   Potassium 3.6 3.5 - 5.1 mmol/L   Chloride 105 101 - 111 mmol/L   CO2 25 22 - 32 mmol/L   Glucose, Bld 182 (H) 65 - 99 mg/dL   BUN 14 6 - 20 mg/dL   Creatinine, Ser 7.82 (H) 0.44 - 1.00 mg/dL   Calcium 8.4 (L) 8.9 - 10.3 mg/dL   Total Protein 6.2 (L) 6.5 - 8.1 g/dL   Albumin 3.2 (L) 3.5 - 5.0 g/dL   AST 23 15 - 41 U/L   ALT 15 14 - 54 U/L   Alkaline Phosphatase 83 38 - 126 U/L   Total Bilirubin 0.3 0.3 - 1.2 mg/dL   GFR calc non Af Amer 52 (L) >60 mL/min   GFR calc Af Amer 60 (L) >60 mL/min   Anion gap 7 5 - 15   Dg Chest 2 View  Result Date: 08/24/2016 CLINICAL DATA:  Subacute onset of lower extremity swelling. High blood pressure. Initial encounter. EXAM: CHEST  2 VIEW COMPARISON:  Chest radiograph performed 09/24/2013 FINDINGS: The lungs are well-aerated and clear. There is no evidence of focal opacification, pleural effusion or pneumothorax. The heart is normal in size; the patient is status post median sternotomy. An aortic valve replacement is noted. No acute osseous abnormalities are seen. IMPRESSION: No acute cardiopulmonary process seen. Electronically Signed   By: Roanna Raider M.D.   On: 08/24/2016 02:46    EKG  EKG Interpretation  Date/Time:  Friday August 23 2016 21:30:01 EDT Ventricular Rate:  72 PR Interval:  132 QRS Duration: 126 QT Interval:  438 QTC Calculation: 479 R Axis:   -7 Text Interpretation:  Sinus rhythm Right bundle branch block Confirmed by Okley Magnussen ([id number]) on 08/24/2016 2:03:25 AM        Procedures Procedures (including critical care time)  Medications Ordered in ED Medications  metoprolol tartrate (LOPRESSOR) tablet 25 mg (25 mg Oral Given  08/24/16 0304)      EDP  apologized perfusely for for the wait and that the patient was sent in after hours for an ongoing problem for which we do not have that test in the evening.    Final Clinical Impressions(s) / ED Diagnoses  I highly doubt DVT in this patient with ongoing symptoms.  The patient is very well appearing and has been observed in the ED.  Strict return precautions given for Chest pain shortness of breath, weakness,  intractable abdominal pain, hard or rigid abdomen, inability to pass gas or stool, intractable diarrhea.  inability to tolerate oral liquids or foods, shortness of breath, changes in vision or thinking, chest pain, dyspnea on exertion, weakness or numbness or any concerns. No signs of systemic illness or infection. The patient is nontoxic-appearing on exam and vital signs are within normal limits. Set up for outpatient DVT studies.   I have reviewed the triage vital signs and the nursing notes. Pertinent labs &imaging results that were available during my care of the patient were reviewed by me and considered in my medical decision making (see chart for details).  After history, exam, and medical workup I feel the patient has been appropriately medically screened and is safe for discharge home. Pertinent diagnoses were discussed with the patient. Patient was given return precautions.  I personally performed the services described in this documentation, which was scribed in my presence. The recorded information has been reviewed and is accurate.      Ivette Castronova, MD 08/24/16 [phone]

## 2016-08-24 NOTE — ED Notes (Signed)
Patient transported to X-ray 

## 2016-08-24 NOTE — ED Notes (Signed)
ED Provider at bedside. 

## 2016-08-24 NOTE — Progress Notes (Addendum)
VASCULAR LAB PRELIMINARY  PRELIMINARY  PRELIMINARY  PRELIMINARY  Bilateral lower extremity venous duplex completed.    Preliminary report:  There is no obvious evidence of DVT or SVT noted in the visualized veins of the bilateral lower extremities. Interstitial edema noted in the bilateral calves.  Allice Garro, RVT 08/24/2016, 9:07 AM

## 2016-08-26 NOTE — Telephone Encounter (Signed)
Patient went to ED

## 2016-08-28 ENCOUNTER — Telehealth: Payer: Self-pay | Admitting: Student

## 2016-08-28 NOTE — Telephone Encounter (Signed)
Received records from [facility] for appointment on 09/09/16 with Randall AnBrittany Strader, PA.  Records put with Brittany's schedule for 09/09/16. lp

## 2016-09-02 ENCOUNTER — Ambulatory Visit (INDEPENDENT_AMBULATORY_CARE_PROVIDER_SITE_OTHER): Payer: Medicare PPO | Admitting: Physician Assistant

## 2016-09-02 ENCOUNTER — Encounter: Payer: Self-pay | Admitting: Physician Assistant

## 2016-09-02 VITALS — BP 225/93 | HR 82 | Ht 61.5 in | Wt 199.0 lb

## 2016-09-02 DIAGNOSIS — Z9119 Patient's noncompliance with other medical treatment and regimen: Secondary | ICD-10-CM

## 2016-09-02 DIAGNOSIS — E785 Hyperlipidemia, unspecified: Secondary | ICD-10-CM | POA: Diagnosis not present

## 2016-09-02 DIAGNOSIS — E119 Type 2 diabetes mellitus without complications: Secondary | ICD-10-CM

## 2016-09-02 DIAGNOSIS — I10 Essential (primary) hypertension: Secondary | ICD-10-CM | POA: Diagnosis not present

## 2016-09-02 DIAGNOSIS — I25810 Atherosclerosis of coronary artery bypass graft(s) without angina pectoris: Secondary | ICD-10-CM

## 2016-09-02 DIAGNOSIS — Z91199 Patient's noncompliance with other medical treatment and regimen due to unspecified reason: Secondary | ICD-10-CM

## 2016-09-02 MED ORDER — CARVEDILOL 12.5 MG PO TABS: 12.5000 mg | ORAL_TABLET | Freq: Two times a day (BID) | ORAL | 3 refills | Status: DC

## 2016-09-02 NOTE — Patient Instructions (Addendum)
Medication Instructions:   STOP Valsartan-HCTZ (Diovan-HCT) STOP Metoprolol  START Carvedilol (Coreg) 12.5mg  TWICE DAILY  Labwork:   none  Testing/Procedures:  none  Follow-Up:  With Azalee Course PA in 3 weeks for further blood pressure medication adjustments.  If you need a refill on your cardiac medications before your next appointment, please call your pharmacy.

## 2016-09-02 NOTE — Progress Notes (Signed)
Cardiology Office Note    Date:  09/04/2016   ID:  [name] [name], DOB 17-Sep-1934, MRN [id number]  PCP:  Legrand Pitts, PA  Cardiologist:  Dr. Peter Swaziland   Chief Complaint  Patient presents with  . Follow-up    uncontrolled BP, discussed with Dr. Swaziland.    History of Present Illness:  [name] [name] is a 82 y.o. female with PMH of HTN, HLD, DM II, CAD s/p CABG x 1 and AS s/p AVR in 08/2010. She was admitted in 2015 for chest pain, she was ruled out for MI. Lexiscan Myoview obtained was normal with EF 57%. She presents today for cardiology office visit. She was most recently in the emergency room for uncontrolled hypertension. Her blood pressure in the office today was 225/93 consistent with her blood pressure is in the ED. She is very much noncompliant with her medication. She still have a third of a bottle of her Pravachol from 36. The last time she filled her medication was in March of this year, so far out of the 90 day Rx, she managed to finish close to 30 day supply in the past 5 months.  I have discontinued her previous medication and the switched her to carvedilol 12.5 mg twice a day. Her daughter who lives close to her will try to help her manage her medication. However ultimately, if also up on her to manage her own medication. During the interview, she keeps saying that she sometimes forget to take her medication. She says she is willing to try to be more compliant for the next few weeks. I will bring her back in 3 weeks for readjustment of her blood pressure. We have discussed the potential possibility of stroke on this elevated level blood pressure. Otherwise she denies any chest pain, shortness breath, lower extremity edema, orthopnea or PND.    Past Medical History:  Diagnosis Date  . Aortic stenosis    s/p AVR August 2012  . CAD (coronary artery disease)    s/p CABG x 15 August 2010  . Degenerative arthritis of knee   . Diabetes mellitus   .  Hypercholesterolemia   . Hyperlipidemia   . Hypertension   . OA (osteoarthritis)   . Obesity     Past Surgical History:  Procedure Laterality Date  . AORTIC VALVE REPLACEMENT  08/17/2010   using a 21-mm Edwards pericardial Magna - Ease valve.   Marland Kitchen BACK SURGERY    . CARDIAC CATHETERIZATION  07/25/2010   Dr Swaziland  . CORONARY ARTERY BYPASS GRAFT  08/17/10   x1, Dr Laneta Simmers with LIMA to LAD  . TOTAL KNEE ARTHROPLASTY Right 08/26/2012   Dr Eulah Pont  . TOTAL KNEE ARTHROPLASTY Right 08/26/2012   Procedure: TOTAL KNEE ARTHROPLASTY;  Surgeon: Loreta Ave, MD;  Location: [facility] OR;  Service: Orthopedics;  Laterality: Right;    Current Medications: Outpatient Medications Prior to Visit  Medication Sig Dispense Refill  . clopidogrel (PLAVIX) 75 MG tablet Take 1 tablet (75 mg total) by mouth daily. 90 tablet 3  . pravastatin (PRAVACHOL) 80 MG tablet Take 1 tablet (80 mg total) by mouth daily. 30 tablet 3  . metoprolol tartrate (LOPRESSOR) 25 MG tablet Take 1 tablet (25 mg total) by mouth 2 (two) times daily. PATIENT NEEDS OFFICE VISIT FOR ADDITIONAL REFILLS 180 tablet 3  . valsartan-hydrochlorothiazide (DIOVAN-HCT) 320-25 MG per tablet Take 1 tablet by mouth daily. 90 tablet 3  . metFORMIN (GLUCOPHAGE) 1000 MG tablet Take 1 tablet (1,000  mg total) by mouth 2 (two) times daily with a meal. 180 tablet 3   No facility-administered medications prior to visit.      Allergies:   Crestor [rosuvastatin calcium]; Lipitor [atorvastatin calcium]; Norvasc [amlodipine besylate]; and Aspirin   Social History   Social History  . Marital status: Widowed    Spouse name: N/A  . Number of children: 3  . Years of education: N/A   Social History Main Topics  . Smoking status: Never Smoker  . Smokeless tobacco: Never Used  . Alcohol use Yes     Comment: rarely  . Drug use: No  . Sexual activity: No   Other Topics Concern  . None   Social History Narrative  . None     Family History:  The patient's  family history includes Heart disease in her father; Tuberculosis in her mother.   ROS:   Please see the history of present illness.    ROS All other systems reviewed and are negative.   PHYSICAL EXAM:   VS:  BP (!) 225/93   Pulse 82   Ht 5' 1.5" (1.562 m)   Wt 199 lb (90.3 kg)   BMI 36.99 kg/m    GEN: Well nourished, well developed, in no acute distress  HEENT: normal  Neck: no JVD, carotid bruits, or masses Cardiac: RRR; no murmurs, rubs, or gallops,no edema  Respiratory:  clear to auscultation bilaterally, normal work of breathing GI: soft, nontender, nondistended, + BS MS: no deformity or atrophy  Skin: warm and dry, no rash Neuro:  Alert and Oriented x 3, Strength and sensation are intact Psych: euthymic mood, full affect  Wt Readings from Last 3 Encounters:  09/02/16 199 lb (90.3 kg)  01/28/14 189 lb 3.2 oz (85.8 kg)  10/18/13 184 lb 12.8 oz (83.8 kg)      Studies/Labs Reviewed:   EKG:  EKG is not ordered today.    Recent Labs: 08/23/2016: ALT 15; BUN 14; Creatinine, Ser 1.01; Hemoglobin 12.1; Platelets 211; Potassium 3.6; Sodium 137   Lipid Panel    Component Value Date/Time   CHOL 255 (H) 10/18/2013 1513   TRIG 114 10/18/2013 1513   HDL 76 10/18/2013 1513   CHOLHDL 3.4 10/18/2013 1513   VLDL 23 10/18/2013 1513   LDLCALC 156 (H) 10/18/2013 1513   LDLDIRECT 169.9 09/28/2012 1422    Additional studies/ records that were reviewed today include:   Echo 10/22/2010 LV EF: 55%  Study Conclusions  - Left ventricle: Distal septal hypokinesis The cavity size was normal. Systolic function was normal. The estimated ejection fraction was 55%. Wall motion was normal; there were no regional wall motion abnormalities. Doppler parameters are consistent with abnormal left ventricular relaxation (grade 1 diastolic dysfunction). - Aortic valve: Prosthetic AVR not well seen but normal systolic gradient and no perivalvular AR A pericardial bioprosthesis  was present. - Left atrium: The atrium was mildly dilated. - Atrial septum: No defect or patent foramen ovale was identified.   ASSESSMENT:    1. Uncontrolled hypertension   2. Noncompliance   3. Coronary artery disease involving coronary bypass graft of native heart without angina pectoris   4. Essential hypertension   5. Hyperlipidemia, unspecified hyperlipidemia type   6. Controlled type 2 diabetes mellitus without complication, without long-term current use of insulin (HCC)      PLAN:  In order of problems listed above:  1. Uncontrolled hypertension: Secondary to noncompliance with medication. Her blood pressure today is over 200, the same  as her blood pressure in the recent emergency room visit. I did discuss the case with Dr. Swaziland, we did not give her clonidine in the office as it is a poor medication to give to someone who is noncompliant. Her main issue unfortunately has noncompliance. I have taken a look at her medication bottles, she has only been able to finish 30 day supply of the 90 day prescription she filled out in March. I plan to change her metoprolol to carvedilol 12.5 mg twice a day for better blood pressure control. However ultimately impressed upon her to take the medication. She is aware that with chronically elevated blood pressure, it can introduce stroke and have an adverse effect on her heart.  2. CAD s/p CABG: Denies any chest pain  3. Hypertension: Switch metoprolol to carvedilol 12.5 mg twice a day. Previously she was also on losartan-HCTZ which has been stopped due to recent recall.  4. Hyperlipidemia: On pravastatin, unfortunately noncompliant with medication  5. DM 2: We'll defer to primary care provider    Medication Adjustments/Labs and Tests Ordered: Current medicines are reviewed at length with the patient today.  Concerns regarding medicines are outlined above.  Medication changes, Labs and Tests ordered today are listed in the Patient  Instructions below. Patient Instructions  Medication Instructions:   STOP Valsartan-HCTZ (Diovan-HCT) STOP Metoprolol  START Carvedilol (Coreg) 12.5mg  TWICE DAILY  Labwork:   none  Testing/Procedures:  none  Follow-Up:  With Azalee Course PA in 3 weeks for further blood pressure medication adjustments.  If you need a refill on your cardiac medications before your next appointment, please call your pharmacy.      Ramond Dial, [place]  09/04/2016 6:22 AM    [facility] [address] [place], [place], [place]  [address] Phone: [phone]; Fax: [phone]

## 2016-09-04 ENCOUNTER — Encounter: Payer: Self-pay | Admitting: Physician Assistant

## 2016-09-09 ENCOUNTER — Ambulatory Visit: Payer: Medicare PPO | Admitting: Student

## 2016-09-24 ENCOUNTER — Ambulatory Visit: Payer: Medicare PPO | Admitting: Physician Assistant

## 2016-10-08 ENCOUNTER — Ambulatory Visit: Payer: Medicare PPO | Admitting: Physician Assistant

## 2016-10-08 NOTE — Progress Notes (Deleted)
Cardiology Office Note    Date:  10/08/2016   ID:  [name], DOB 1934/06/30, MRN [id number]  PCP:  Legrand Pitts, PA  Cardiologist:  Dr. Peter Swaziland    No chief complaint on file.   History of Present Illness:  [name] [name] is a 82 y.o. female with PMH of HTN, HLD, DM II, CAD s/p CABG x 1 and AS s/p AVR in 08/2010. She was admitted in 2015 for chest pain, she was ruled out for MI. Lexiscan Myoview obtained was normal with EF 57%. She was most recently in the emergency room for uncontrolled hypertension. Her blood pressure in the office today was 225/93 consistent with her blood pressure is in the ED. She is very much noncompliant with her medication. During our most recent office visit on 09/02/2016, she still had a third of a bottle of her Pravachol from 38. The last time she filled her medication was in March of this year, however out of the 90 day prescription, she managed to finish only 30 day supply in the past 5 months.  I stopped her valsartan-HCTZ and metoprolol and switched her to carvedilol 12.5 mg twice a day. Her daughter who live close by will attempt to manage her medication. She returned today for medication titration.  No EKG  Past Medical History:  Diagnosis Date  . Aortic stenosis    s/p AVR August 2012  . CAD (coronary artery disease)    s/p CABG x 15 August 2010  . Degenerative arthritis of knee   . Diabetes mellitus   . Hypercholesterolemia   . Hyperlipidemia   . Hypertension   . OA (osteoarthritis)   . Obesity     Past Surgical History:  Procedure Laterality Date  . AORTIC VALVE REPLACEMENT  08/17/2010   using a 21-mm Edwards pericardial Magna - Ease valve.   Marland Kitchen BACK SURGERY    . CARDIAC CATHETERIZATION  07/25/2010   Dr Swaziland  . CORONARY ARTERY BYPASS GRAFT  08/17/10   x1, Dr Laneta Simmers with LIMA to LAD  . TOTAL KNEE ARTHROPLASTY Right 08/26/2012   Dr Eulah Pont  . TOTAL KNEE ARTHROPLASTY Right 08/26/2012   Procedure: TOTAL KNEE  ARTHROPLASTY;  Surgeon: Loreta Ave, MD;  Location: [facility];  Service: Orthopedics;  Laterality: Right;    Current Medications: Outpatient Medications Prior to Visit  Medication Sig Dispense Refill  . carvedilol (COREG) 12.5 MG tablet Take 1 tablet (12.5 mg total) by mouth 2 (two) times daily. 60 tablet 3  . clopidogrel (PLAVIX) 75 MG tablet Take 1 tablet (75 mg total) by mouth daily. 90 tablet 3  . metFORMIN (GLUCOPHAGE) 1000 MG tablet Take 1 tablet (1,000 mg total) by mouth 2 (two) times daily with a meal. 180 tablet 3  . pravastatin (PRAVACHOL) 80 MG tablet Take 1 tablet (80 mg total) by mouth daily. 30 tablet 3   No facility-administered medications prior to visit.      Allergies:   Crestor [rosuvastatin calcium]; Lipitor [atorvastatin calcium]; Norvasc [amlodipine besylate]; and Aspirin   Social History   Social History  . Marital status: Widowed    Spouse name: N/A  . Number of children: 3  . Years of education: N/A   Social History Main Topics  . Smoking status: Never Smoker  . Smokeless tobacco: Never Used  . Alcohol use Yes     Comment: rarely  . Drug use: No  . Sexual activity: No   Other Topics Concern  . Not on  file   Social History Narrative  . No narrative on file     Family History:  The patient's ***family history includes Heart disease in her father; Tuberculosis in her mother.   ROS:   Please see the history of present illness.    ROS All other systems reviewed and are negative.   PHYSICAL EXAM:   VS:  There were no vitals taken for this visit.   GEN: Well nourished, well developed, in no acute distress HEENT: normal Neck: no JVD, carotid bruits, or masses Cardiac: ***RRR; no murmurs, rubs, or gallops,no edema  Respiratory:  clear to auscultation bilaterally, normal work of breathing GI: soft, nontender, nondistended, + BS MS: no deformity or atrophy Skin: warm and dry, no rash Neuro:  Alert and Oriented x 3, Strength and sensation are  intact Psych: euthymic mood, full affect  Wt Readings from Last 3 Encounters:  09/02/16 199 lb (90.3 kg)  01/28/14 189 lb 3.2 oz (85.8 kg)  10/18/13 184 lb 12.8 oz (83.8 kg)      Studies/Labs Reviewed:   EKG:  EKG is*** ordered today.  The ekg ordered today demonstrates ***  Recent Labs: 08/23/2016: ALT 15; BUN 14; Creatinine, Ser 1.01; Hemoglobin 12.1; Platelets 211; Potassium 3.6; Sodium 137   Lipid Panel    Component Value Date/Time   CHOL 255 (H) 10/18/2013 1513   TRIG 114 10/18/2013 1513   HDL 76 10/18/2013 1513   CHOLHDL 3.4 10/18/2013 1513   VLDL 23 10/18/2013 1513   LDLCALC 156 (H) 10/18/2013 1513   LDLDIRECT 169.9 09/28/2012 1422    Additional studies/ records that were reviewed today include:   Echo 10/22/2010 LV EF: 55%  Study Conclusions  - Left ventricle: Distal septal hypokinesis The cavity size was normal. Systolic function was normal. The estimated ejection fraction was 55%. Wall motion was normal; there were no regional wall motion abnormalities. Doppler parameters are consistent with abnormal left ventricular relaxation (grade 1 diastolic dysfunction). - Aortic valve: Prosthetic AVR not well seen but normal systolic gradient and no perivalvular AR A pericardial bioprosthesis was present. - Left atrium: The atrium was mildly dilated. - Atrial septum: No defect or patent foramen ovale was identified.   ASSESSMENT:    No diagnosis found.   PLAN:  In order of problems listed above:  1. ***    Medication Adjustments/Labs and Tests Ordered: Current medicines are reviewed at length with the patient today.  Concerns regarding medicines are outlined above.  Medication changes, Labs and Tests ordered today are listed in the Patient Instructions below. There are no Patient Instructions on file for this visit.   Ramond Dial, [place]  10/08/2016 8:06 AM    [facility] [address] [place], [place], [place]   [address] Phone: [phone]; Fax: [phone]

## 2018-05-15 ENCOUNTER — Telehealth: Payer: Self-pay | Admitting: Cardiology

## 2018-05-15 NOTE — Telephone Encounter (Signed)
   [facility] Pre-operative Risk Assessment    Request for surgical clearance:  1. What type of surgery is being performed? Combined PE IOL/[place] Goniotomy    2. When is this surgery scheduled? 05/19/2018   3. What type of clearance is required (medical clearance vs. Pharmacy clearance to hold med vs. Both)? Both   4. Are there any medications that need to be held prior to surgery and how long? plavix - 10 days prior   5. Practice name and name of physician performing surgery? Dr. Lucky Rathke Schoedel @ [facility]  6. What is your office phone number - not on fax sheet   7.   What is your office fax number [phone] (attn: Candise) - please send last OV note   8.   Anesthesia type (None, local, MAC, general) ? Local anesthesia with light sedation    Fidel Levy 05/15/2018, 9:53 AM  _________________________________________________________________   (provider comments below)

## 2018-05-15 NOTE — Telephone Encounter (Signed)
   Primary Cardiologist: Dr. Swaziland (2018)  Chart reviewed as part of pre-operative protocol coverage. Because of [name] [name]'s past medical history and time since last visit, he/she will require a follow-up visit in order to better assess preoperative cardiovascular risk.   We have not seen this patient since 2018, at which time she was noncompliant with her medications with uncontrolled hypertension. Unclear who has been managing her plavix. When I called the patient, she states she lives in [place] and will not be traveling to our office in [place], [place]. She states that Dr. Sherrin Daisy manages her plavix.   I will route this to the requesting office via Epic fax function.    [name] Rutherford Alvaretta Eisenberger, PA  05/15/2018, 10:44 AM

## 2018-05-26 ENCOUNTER — Telehealth (INDEPENDENT_AMBULATORY_CARE_PROVIDER_SITE_OTHER): Payer: Medicare PPO | Admitting: Physician Assistant

## 2018-05-26 ENCOUNTER — Encounter: Payer: Self-pay | Admitting: *Deleted

## 2018-05-26 VITALS — Ht 61.5 in | Wt 195.0 lb

## 2018-05-26 DIAGNOSIS — Z01810 Encounter for preprocedural cardiovascular examination: Secondary | ICD-10-CM

## 2018-05-26 DIAGNOSIS — E119 Type 2 diabetes mellitus without complications: Secondary | ICD-10-CM

## 2018-05-26 DIAGNOSIS — I10 Essential (primary) hypertension: Secondary | ICD-10-CM | POA: Diagnosis not present

## 2018-05-26 DIAGNOSIS — Z952 Presence of prosthetic heart valve: Secondary | ICD-10-CM

## 2018-05-26 DIAGNOSIS — E785 Hyperlipidemia, unspecified: Secondary | ICD-10-CM | POA: Diagnosis not present

## 2018-05-26 DIAGNOSIS — I25810 Atherosclerosis of coronary artery bypass graft(s) without angina pectoris: Secondary | ICD-10-CM

## 2018-05-26 MED ORDER — CARVEDILOL 25 MG PO TABS: 25.0000 mg | ORAL_TABLET | Freq: Two times a day (BID) | ORAL | 1 refills | Status: DC

## 2018-05-26 NOTE — Progress Notes (Signed)
Virtual Visit via Telephone Note   This visit type was conducted due to national recommendations for restrictions regarding the COVID-19 Pandemic (e.g. social distancing) in an effort to limit this patient's exposure and mitigate transmission in our community.  Due to her co-morbid illnesses, this patient is at least at moderate risk for complications without adequate follow up.  This format is felt to be most appropriate for this patient at this time.  The patient did not have access to video technology/had technical difficulties with video requiring transitioning to audio format only (telephone).  All issues noted in this document were discussed and addressed.  No physical exam could be performed with this format.  Please refer to the patient's chart for her  consent to telehealth for [facility].   Date:  05/26/2018   ID:  [name] [name], DOB August 25, 1934, MRN [id number]  Patient Location: Home Provider Location: Home  PCP:  Legrand Pitts, PA  Cardiologist:  Peter Swaziland, MD  Electrophysiologist:  None   Evaluation Performed:  Follow-Up Visit  Chief Complaint: Preoperative clearance  History of Present Illness:    [name] [name] is a 84 y.o. female with PMH of HTN, HLD, DM II, CAD s/p CABG x 1 and AS s/p AVR in 08/2010. She was admitted in 2015 for chest pain, she was ruled out for MI. Lexiscan Myoview obtained was normal with EF 57%.   I last saw the patient in August 2018, she was not compliant with her medication at the time.  I discontinued her previous medication and switched her to carvedilol 12.5 mg twice daily.  Her daughter is trying to help her manage her medications well.  I asked her to return for 73-month visit, however she has not been lost to follow-up since.  She had a MRI in September 2018 demonstrated bilateral subdural hemorrhage.  She was also evaluated in the ED for blurry vision in the left eye.  Repeat CT did not reveal any subdural hemorrhage or acute  changes.  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  Patient was contacted today via telephone visit along with her daughter.  She is planning for glaucoma eye surgery on 06/05/2018.  She denies any chest discomfort or shortness of breath.  According to the patient, she is very active and declined climb up and down stairs several times a day without any exertional chest pain or shortness of breath.  She clearly can accomplish at least 4 METS of activity, therefore she is cleared to proceed with surgery and hold Plavix for 10 days prior to the surgery and restart as soon as possible afterward at the discretion of the surgeon.  My only concern is her blood pressure.  I suspect she is still noncompliant with her blood pressure medication.  She just took her carvedilol at 2:30 PM given today's interview.  Her blood pressure according to her daughter who brought over a blood pressure cuff was 220/103 heart rate is 72.I will increase her carvedilol to 25 mg twice daily.  I will see her back in 1 week.  If her systolic blood pressure is less than 180, then she can proceed with surgery.  She says that she was recently seen by her PCP about a month ago, we will request records.  The patient does not have symptoms concerning for COVID-19 infection (fever, chills, cough, or new shortness of breath).    Past Medical  History:  Diagnosis Date   Aortic stenosis    s/p AVR August 2012   CAD (coronary artery disease)    s/p CABG x 15 August 2010   Degenerative arthritis of knee    Diabetes mellitus    Hypercholesterolemia    Hyperlipidemia    Hypertension    OA (osteoarthritis)    Obesity    Past Surgical History:  Procedure Laterality Date   AORTIC VALVE REPLACEMENT  08/17/2010   using a 21-mm Edwards pericardial Magna - Ease valve.    BACK SURGERY     CARDIAC CATHETERIZATION  07/25/2010   Dr SwazilandJordan   CORONARY  ARTERY BYPASS GRAFT  08/17/10   x1, Dr Laneta SimmersBartle with LIMA to LAD   TOTAL KNEE ARTHROPLASTY Right 08/26/2012   Dr Eulah PontMurphy   TOTAL KNEE ARTHROPLASTY Right 08/26/2012   Procedure: TOTAL KNEE ARTHROPLASTY;  Surgeon: Loreta Aveaniel F Murphy, MD;  [facility];  Service: Orthopedics;  Laterality: Right;     Current Meds  Medication Sig   carvedilol (COREG) 12.5 MG tablet Take 1 tablet (12.5 mg total) by mouth 2 (two) times daily.   clopidogrel (PLAVIX) 75 MG tablet Take 1 tablet (75 mg total) by mouth daily.   pravastatin (PRAVACHOL) 80 MG tablet Take 1 tablet (80 mg total) by mouth daily.     Allergies:   Crestor [rosuvastatin calcium]; Lipitor [atorvastatin calcium]; Norvasc [amlodipine besylate]; and Aspirin   Social History   Tobacco Use   Smoking status: Never Smoker   Smokeless tobacco: Never Used  Substance Use Topics   Alcohol use: Yes    Comment: rarely   Drug use: No     Family Hx: The patient's family history includes Heart disease in her father; Tuberculosis in her mother.  ROS:   Please see the history of present illness.     All other systems reviewed and are negative.   Prior CV studies:   The following studies were reviewed today:  Echo 10/22/2010 LV EF: 55% Study Conclusions  - Left ventricle: Distal septal hypokinesis The cavity size was normal. Systolic function was normal. The estimated ejection fraction was 55%. Wall motion was normal; there were no regional wall motion abnormalities. Doppler parameters are consistent with abnormal left ventricular relaxation (grade 1 diastolic dysfunction). - Aortic valve: Prosthetic AVR not well seen but normal systolic gradient and no perivalvular AR A pericardial bioprosthesis was present. - Left atrium: The atrium was mildly dilated. - Atrial septum: No defect or patent foramen ovale was identified.  Labs/Other Tests and Data Reviewed:    EKG:  An ECG dated 08/23/2016 was personally reviewed  today and demonstrated:  Normal sinus rhythm with right bundle branch block.  Recent Labs: No results found for requested labs within last 8760 hours.   Recent Lipid Panel Lab Results  Component Value Date/Time   CHOL 255 (H) 10/18/2013 03:13 PM   TRIG 114 10/18/2013 03:13 PM   HDL 76 10/18/2013 03:13 PM   CHOLHDL 3.4 10/18/2013 03:13 PM   LDLCALC 156 (H) 10/18/2013 03:13 PM   LDLDIRECT 169.9 09/28/2012 02:22 PM    Wt Readings from Last 3 Encounters:  05/26/18 195 lb (88.5 kg)  09/02/16 199 lb (90.3 kg)  01/28/14 189 lb 3.2 oz (85.8 kg)     Objective:    Vital Signs:  Ht 5' 1.5" (1.562 m)    Wt 195 lb (88.5 kg)    BMI 36.25 kg/m    VITAL SIGNS:  reviewed  ASSESSMENT & PLAN:  1. Preoperative clearance: She is able to do at least 4 METS of activity without any exertional symptoms.  He is cleared to proceed with the low risk procedure without any further work-up.  She will need to start holding the Plavix today and restart after the procedure as soon as possible.  2. Uncontrolled hypertension: Unfortunately I still suspect she is not very compliant with her medication.  She just took her carvedilol right before the visit, her systolic blood pressure is over 200.  I repeatedly asked her to be more compliant with her medication.  I did increase her carvedilol to 25 mg twice daily as well.  3. Hyperlipidemia: Continue Pravachol  4. DM2: Followed by primary care provider.  5. History of AVR: She is due for repeat echocardiogram, however this is not urgent.  COVID-19 Education: The signs and symptoms of COVID-19 were discussed with the patient and how to seek care for testing (follow up with PCP or arrange E-visit).  The importance of social distancing was discussed today.  Time:   Today, I have spent 13 minutes with the patient with telehealth technology discussing the above problems.     Medication Adjustments/Labs and Tests Ordered: Current medicines are reviewed at  length with the patient today.  Concerns regarding medicines are outlined above.   Tests Ordered: No orders of the defined types were placed in this encounter.   Medication Changes: No orders of the defined types were placed in this encounter.   Disposition:  Follow up in 1 week(s)  Signed, Azalee Course, PA  05/26/2018 2:32 PM    [facility]

## 2018-05-26 NOTE — Patient Instructions (Addendum)
Your physician recommends that you schedule a follow-up appointment in: 1 WEEK WITH HAO MENG OR DR Swaziland TELEHEALTH  Your physician has recommended you make the following change in your medication:   INCREASE COREG 25 MG TWICE DAILY   HOLD PLAVIX UNTIL AFTER EYE SURGERY   Thank you for choosing [facility]!!

## 2019-05-20 ENCOUNTER — Telehealth: Payer: Self-pay | Admitting: *Deleted

## 2019-05-20 NOTE — Telephone Encounter (Signed)
PRIMARY DR [place]      [facility] Pre-operative Risk Assessment    Request for surgical clearance:  1. What type of surgery is being performed?  COMBINED PE IOL/ECP/KAHOOK  2. When is this surgery scheduled?  [place] 05/31/19  What type of clearance is required (medical clearance vs. Pharmacy clearance to hold med vs. Both)?  MEDICAL   3. Are there any medications that need to be held prior to surgery and how long?  STOP 10 DAYS PRE OP - PLAVIX 4.   5. Practice name and name of physician performing surgery? ( [place])  [place] ( [facility] [place];  DR TAD D. SCHOEDEL  6. What is your office phone number   7.   What is your office fax number [place]   8.   Anesthesia type (None, local, MAC, general) ?  [place]    Raiford Simmonds 05/20/2019, 3:48 PM  _________________________________________________________________   (provider comments below)

## 2022-07-06 ENCOUNTER — Emergency Department (HOSPITAL_COMMUNITY)
Admission: EM | Admit: 2022-07-06 | Discharge: 2022-07-09 | Disposition: A | Discharge status: Home / Self Care | Payer: Medicare PPO | Attending: Emergency Medicine | Admitting: Emergency Medicine

## 2022-07-06 ENCOUNTER — Emergency Department (HOSPITAL_COMMUNITY): Payer: Medicare PPO

## 2022-07-06 ENCOUNTER — Other Ambulatory Visit: Payer: Self-pay

## 2022-07-06 ENCOUNTER — Encounter (HOSPITAL_COMMUNITY): Payer: Self-pay | Admitting: *Deleted

## 2022-07-06 DIAGNOSIS — Z7984 Long term (current) use of oral hypoglycemic drugs: Secondary | ICD-10-CM | POA: Insufficient documentation

## 2022-07-06 DIAGNOSIS — I25119 Atherosclerotic heart disease of native coronary artery with unspecified angina pectoris: Secondary | ICD-10-CM

## 2022-07-06 DIAGNOSIS — Z9181 History of falling: Secondary | ICD-10-CM | POA: Insufficient documentation

## 2022-07-06 DIAGNOSIS — N189 Chronic kidney disease, unspecified: Secondary | ICD-10-CM | POA: Insufficient documentation

## 2022-07-06 DIAGNOSIS — F0390 Unspecified dementia without behavioral disturbance: Secondary | ICD-10-CM | POA: Insufficient documentation

## 2022-07-06 DIAGNOSIS — R464 Slowness and poor responsiveness: Secondary | ICD-10-CM | POA: Insufficient documentation

## 2022-07-06 DIAGNOSIS — I129 Hypertensive chronic kidney disease with stage 1 through stage 4 chronic kidney disease, or unspecified chronic kidney disease: Secondary | ICD-10-CM | POA: Diagnosis not present

## 2022-07-06 DIAGNOSIS — R2681 Unsteadiness on feet: Secondary | ICD-10-CM | POA: Diagnosis not present

## 2022-07-06 DIAGNOSIS — Z7902 Long term (current) use of antithrombotics/antiplatelets: Secondary | ICD-10-CM | POA: Insufficient documentation

## 2022-07-06 DIAGNOSIS — E1122 Type 2 diabetes mellitus with diabetic chronic kidney disease: Secondary | ICD-10-CM | POA: Insufficient documentation

## 2022-07-06 DIAGNOSIS — I2510 Atherosclerotic heart disease of native coronary artery without angina pectoris: Secondary | ICD-10-CM | POA: Diagnosis not present

## 2022-07-06 DIAGNOSIS — R531 Weakness: Secondary | ICD-10-CM | POA: Insufficient documentation

## 2022-07-06 HISTORY — DX: Unspecified dementia, unspecified severity, without behavioral disturbance, psychotic disturbance, mood disturbance, and anxiety: F03.90

## 2022-07-06 LAB — CBC WITH DIFFERENTIAL/PLATELET
Abs Immature Granulocytes: 0.01 10*3/uL (ref 0.00–0.07)
Basophils Absolute: 0 10*3/uL (ref 0.0–0.1)
Basophils Relative: 1 %
Eosinophils Absolute: 0.1 10*3/uL (ref 0.0–0.5)
Eosinophils Relative: 1 %
HCT: 36.9 % (ref 36.0–46.0)
Hemoglobin: 11.6 g/dL — ABNORMAL LOW (ref 12.0–15.0)
Immature Granulocytes: 0 %
Lymphocytes Relative: 23 %
Lymphs Abs: 1.1 10*3/uL (ref 0.7–4.0)
MCH: 27.1 pg (ref 26.0–34.0)
MCHC: 31.4 g/dL (ref 30.0–36.0)
MCV: 86.2 fL (ref 80.0–100.0)
Monocytes Absolute: 0.4 10*3/uL (ref 0.1–1.0)
Monocytes Relative: 9 %
Neutro Abs: 3.1 10*3/uL (ref 1.7–7.7)
Neutrophils Relative %: 66 %
Platelets: 109 10*3/uL — ABNORMAL LOW (ref 150–400)
RBC: 4.28 MIL/uL (ref 3.87–5.11)
RDW: 15 % (ref 11.5–15.5)
WBC: 4.8 10*3/uL (ref 4.0–10.5)
nRBC: 0 % (ref 0.0–0.2)

## 2022-07-06 LAB — URINALYSIS, ROUTINE W REFLEX MICROSCOPIC
Bacteria, UA: NONE SEEN
Bilirubin Urine: NEGATIVE
Glucose, UA: NEGATIVE mg/dL
Ketones, ur: 5 mg/dL — AB
Leukocytes,Ua: NEGATIVE
Nitrite: NEGATIVE
Protein, ur: 100 mg/dL — AB
Specific Gravity, Urine: 1.014 (ref 1.005–1.030)
pH: 5 (ref 5.0–8.0)

## 2022-07-06 LAB — BASIC METABOLIC PANEL
Anion gap: 14 (ref 5–15)
BUN: 50 mg/dL — ABNORMAL HIGH (ref 8–23)
CO2: 25 mmol/L (ref 22–32)
Calcium: 8.3 mg/dL — ABNORMAL LOW (ref 8.9–10.3)
Chloride: 103 mmol/L (ref 98–111)
Creatinine, Ser: 3.33 mg/dL — ABNORMAL HIGH (ref 0.44–1.00)
GFR, Estimated: 13 mL/min — ABNORMAL LOW (ref >60)
Glucose, Bld: 112 mg/dL — ABNORMAL HIGH (ref 70–99)
Potassium: 3.2 mmol/L — ABNORMAL LOW (ref 3.5–5.1)
Sodium: 142 mmol/L (ref 135–145)

## 2022-07-06 LAB — CBG MONITORING, ED: Glucose-Capillary: 107 mg/dL — ABNORMAL HIGH (ref 70–99)

## 2022-07-06 NOTE — ED Triage Notes (Signed)
Assisted pt out of car, family states pt has dementia.  Pt with new generalized weakness today.  Pt usually uses a walker but unable to walk today.  Pt denies pain. Pt with altered mental per family. Family states she fell forward while sitting and hit her face on walker. Bruising noted to right lower lip

## 2022-07-06 NOTE — ED Provider Notes (Signed)
[place] EMERGENCY DEPARTMENT AT [facility] Provider Note   CSN: [id number] Arrival date & time: 07/06/22  1817     History  No chief complaint on file.   [name] [name] is a 88 y.o. female.  Has PMH of dementia, peripheral vascular disease, CKD, blindness in the left eye, CAD, diabetes, hypertension.   He is brought to the ER today from her family for generalized weakness since this morning, reports they were elevated or stable around to go to the bathroom upon waking up this morning, tried several times related to get her up, several other family members came to assist and patient hit her lip on her walker when she fell forward and then was unresponsive.  They use a sheet to carry her to their truck and bring her to the ED for evaluation.  Did not call EMS because they did not want the patient to go to [facility].   They also noticed some wheezing today which is new for her as well.  She has not had recent cough or fevers.  She was her usual self yesterday.  HPI     Home Medications Prior to Admission medications   Medication Sig Start Date End Date Taking? Authorizing Provider  carvedilol (COREG) 25 MG tablet Take 1 tablet (25 mg total) by mouth 2 (two) times daily. 05/26/18 08/24/18  Azalee Course, PA  clopidogrel (PLAVIX) 75 MG tablet Take 1 tablet (75 mg total) by mouth daily. 10/05/13   [place], Peter M, MD  metFORMIN (GLUCOPHAGE) 1000 MG tablet Take 1 tablet (1,000 mg total) by mouth 2 (two) times daily with a meal. 10/18/13 08/24/16  Peyton Najjar, MD  pravastatin (PRAVACHOL) 80 MG tablet Take 1 tablet (80 mg total) by mouth daily. 10/21/13   Peyton Najjar, MD      Allergies    Crestor Duncan Dull calcium], Lipitor [atorvastatin calcium], Norvasc [amlodipine besylate], and Aspirin    Review of Systems   Review of Systems  Physical Exam Updated Vital Signs BP (!) 106/51 (BP Location: Left Arm)   Pulse 63   Temp 97.8 F (36.6 C) (Oral)   Resp  (!) 23   Ht 5' 1.5" (1.562 m)   SpO2 97%   BMI 36.25 kg/m  Physical Exam Vitals and nursing note reviewed.  Constitutional:      General: She is not in acute distress.    Appearance: She is well-developed.  HENT:     Head: Normocephalic and atraumatic.     Nose: Nose normal.     Mouth/Throat:     Mouth: Mucous membranes are moist.  Eyes:     Conjunctiva/sclera: Conjunctivae normal.  Cardiovascular:     Rate and Rhythm: Normal rate and regular rhythm.     Heart sounds: No murmur heard. Pulmonary:     Effort: Pulmonary effort is normal. No respiratory distress.     Breath sounds: Normal breath sounds.  Abdominal:     Palpations: Abdomen is soft.     Tenderness: There is no abdominal tenderness.  Musculoskeletal:        General: No swelling.     Cervical back: Neck supple.  Skin:    General: Skin is warm and dry.     Capillary Refill: Capillary refill takes less than 2 seconds.  Neurological:     General: No focal deficit present.     Mental Status: She is alert.  Psychiatric:        Mood and Affect: Mood  normal.     ED Results / Procedures / Treatments   Labs (all labs ordered are listed, but only abnormal results are displayed) Labs Reviewed  CBC WITH DIFFERENTIAL/PLATELET - Abnormal; Notable for the following components:      Result Value   Hemoglobin 11.6 (*)    Platelets 109 (*)    All other components within normal limits  BASIC METABOLIC PANEL - Abnormal; Notable for the following components:   Potassium 3.2 (*)    Glucose, Bld 112 (*)    BUN 50 (*)    Creatinine, Ser 3.33 (*)    Calcium 8.3 (*)    GFR, Estimated 13 (*)    All other components within normal limits  URINALYSIS, ROUTINE W REFLEX MICROSCOPIC - Abnormal; Notable for the following components:   APPearance HAZY (*)    Hgb urine dipstick MODERATE (*)    Ketones, ur 5 (*)    Protein, ur 100 (*)    All other components within normal limits  CBG MONITORING, ED - Abnormal; Notable for the  following components:   Glucose-Capillary 107 (*)    All other components within normal limits    EKG EKG Interpretation  Date/Time:  Saturday July 06 2022 18:29:54 EDT Ventricular Rate:  63 PR Interval:  142 QRS Duration: 130 QT Interval:  497 QTC Calculation: 509 R Axis:   -20 Text Interpretation: Sinus rhythm Right bundle branch block When compared to prior RBBB now present Confirmed by Vonita Moss [phone]) on 07/06/2022 7:49:14 PM  Radiology DG Chest Portable 1 View  Result Date: 07/06/2022 CLINICAL DATA:  Altered mental status and generalized weakness EXAM: PORTABLE CHEST 1 VIEW COMPARISON:  Radiographs 08/24/2016 FINDINGS: Stable cardiomediastinal silhouette. Sternotomy and AVR. CABG. Aortic atherosclerotic calcification. Trace left pleural effusion or pleural thickening. No focal consolidation or pneumothorax. No displaced rib fractures. IMPRESSION: Trace left pleural effusion or pleural thickening. Chest otherwise negative. Electronically Signed   By: Minerva Fester M.D.   On: 07/06/2022 19:30    Procedures Procedures    Medications Ordered in ED Medications - No data to display  ED Course/ Medical Decision Making/ A&P Clinical Course as of 07/06/22 2136  Sat Jul 06, 2022  19[address] for generalized weakness and decreased mental status since this morning.  She has no focal deficits on exam.  Prior notes from urology from June 25, 2022 note diagnosis of stage IV CKD.  I do not have recent lab values however.  She also has diabetes and hypertension by history.  Labs, urine and chest x-ray at this time. [CB]  2132 Patient has been using a walker for the past several months, has not been to walk since yesterday with a walker, today could not even stand with the VNA assistance and when trying to get her up she fell forward and hit her face on her walker.  Family reports that she "went unresponsive".  In the ED she is at her mental baseline per her family, she has no focal  neurologic deficits, she can lift her extremities off the bed.  Labs are reassuring.  Discussed with family given Plavix use and head injury she needs to have a CT scan of her head.  Unfortunately our CT scanner here is down and she was to be transferred down to [facility]. [CB]    Clinical Course User Index [CB] Carmel Sacramento A, PA-C  Medical Decision Making Ddx: Hepatic encephalopathy, UTI, intracranial hemorrhage, concussion, worsening dementia, pneumonia, other ED course: Patient here for generalized weakness, not able to stand today.  Needs to be transferred to [facility] long due to our lack of CT scanner as she did hit her head on the walker today and is on Plavix.  She is at her mental baseline but still has generalized weakness and is unfortunately not able to ambulate.  Discussed with family she may need social work consult if she is still unable to ambulate and has no medical reason for this.  Family states she has been extremely difficult to take care of at home with her progressive weakness and today they could not get her out of bed even with multiple family members.  She has not had any chest pain or shortness of breath or cough.  Labs ordered and interpreted, she has some hematuria which per her record review has been a persistent issue, she has GFR of 13 but care everywhere labs show history of stage IV CKD, no leukocytosis, mild anemia.  X-ray chest shows trace pleural effusion.  EKG shows no acute findings.  Amount and/or Complexity of Data Reviewed Labs: ordered. Radiology: ordered.           Final Clinical Impression(s) / ED Diagnoses Final diagnoses:  None    Rx / DC Orders ED Discharge Orders     None         Josem Kaufmann 07/06/22 2136    Rondel Baton, MD 07/07/22 1028

## 2022-07-06 NOTE — ED Notes (Signed)
Patient transported to CT 

## 2022-07-07 ENCOUNTER — Emergency Department (HOSPITAL_COMMUNITY): Payer: Medicare PPO

## 2022-07-07 LAB — TROPONIN I (HIGH SENSITIVITY)
Troponin I (High Sensitivity): 25 ng/L — ABNORMAL HIGH (ref <18)
Troponin I (High Sensitivity): 23 ng/L — ABNORMAL HIGH (ref <18)

## 2022-07-07 MED ORDER — BUMETANIDE 1 MG PO TABS
1.0000 mg | ORAL_TABLET | Freq: Every day | ORAL | Status: DC
  Administered 2022-07-07 – 2022-07-08 (×2): 1 mg via ORAL
  Filled 2022-07-07 (×3): qty 1

## 2022-07-07 MED ORDER — DONEPEZIL HCL 5 MG PO TABS
10.0000 mg | ORAL_TABLET | Freq: Every day | ORAL | Status: DC
  Administered 2022-07-07 – 2022-07-08 (×2): 10 mg via ORAL
  Filled 2022-07-07 (×2): qty 2

## 2022-07-07 MED ORDER — ROSUVASTATIN CALCIUM 20 MG PO TABS
40.0000 mg | ORAL_TABLET | Freq: Every day | ORAL | Status: DC
  Administered 2022-07-07 – 2022-07-09 (×3): 40 mg via ORAL
  Filled 2022-07-07 (×3): qty 2

## 2022-07-07 MED ORDER — CLOPIDOGREL BISULFATE 75 MG PO TABS
75.0000 mg | ORAL_TABLET | Freq: Every day | ORAL | Status: DC
  Administered 2022-07-07 – 2022-07-09 (×3): 75 mg via ORAL
  Filled 2022-07-07 (×3): qty 1

## 2022-07-07 MED ORDER — CARVEDILOL 12.5 MG PO TABS
25.0000 mg | ORAL_TABLET | Freq: Two times a day (BID) | ORAL | Status: DC
  Administered 2022-07-07 (×2): 25 mg via ORAL
  Filled 2022-07-07 (×4): qty 2

## 2022-07-07 MED ORDER — AMLODIPINE BESYLATE 5 MG PO TABS
5.0000 mg | ORAL_TABLET | Freq: Once | ORAL | Status: DC
  Filled 2022-07-07: qty 1

## 2022-07-07 MED ORDER — LINAGLIPTIN 5 MG PO TABS
5.0000 mg | ORAL_TABLET | Freq: Every day | ORAL | Status: DC
  Administered 2022-07-07 – 2022-07-09 (×3): 5 mg via ORAL
  Filled 2022-07-07 (×3): qty 1

## 2022-07-07 NOTE — Progress Notes (Signed)
PT note is in and does suggest SNF. AT this time medical clearance is still not clear for this patient. TOC will continue to follow until patient is medically clear. Once the patient is clear TOC will follow up with the family to see if SNF or HH is want the family is wanting.

## 2022-07-07 NOTE — Progress Notes (Signed)
AT this time PT has reached out to this CSW reporting that the patient is currently not stable for PT evaluation This CSW will continue to follow to see if the patient will be admitted due to arising medical concern's. This CSW will continue to update through the shift. If patient is admitted TOC will follow up on the floor.

## 2022-07-07 NOTE — Progress Notes (Addendum)
Addend @ 3:16 pm  At this time there has been no bed offers. TOC will continue to follow up with the patient tomorrow to present bed offers. [facility] also should be contacted if there is no offer from [place] as this CSW has asked star to review. Star has responded that she would review this patient.    Addend@ 2:31 pm  At this time Star / from [facility] has responded that she will review patient. CSW will update when an answered is received.   Per PA patient is now medically clear MRI was normal. At this time there is no need for IN-PT admission. This CSW reached out to the daughter linda in regards to safe DC plan. AT this time the family feels like it is best for the patient to go to a facility for a short term stay. This CSW explained to the family the process for SNF. This CSW also explained to the family that they may review facilites on Medicare. Gov to have an idea of what facility they would choose. The family has mentioned [facility] this CSW has explain that there preference is noted however there is no guarantee .This CSW also reached out to [facility] to see if the family can be reviewed for a possible bed offer. This CSW has sent the patient out through the HUB. Family is aware that patient is DC at this time and will board in the ED until further notice.

## 2022-07-07 NOTE — Evaluation (Signed)
Physical Therapy Evaluation Patient Details Name: [name] [name] MRN: [id number] DOB: [birth year] Today's Date: 07/07/2022  History of Present Illness  Pt admitted from home with family 2* weakness, falls and inability to ambulate.  Pt with hx of DM, dementia, R TKR, obesity, aortic valve replacement and CAD s/'p CABG.  Clinical Impression  Pt admitted as above and presenting initially with generalized weakness but following most cues and able to sit up from gurney and move to EOB sitting and standing with min assist.  Pt standing with RW ~ 1 minute before buckling and becoming non-responsive with eyes rolling back.  Pt assisted back to bed total assist and BP taken in supine 131/82 and pt conversing normally again and asking for a drink.  Pt would benefit from acute stay progress and follow up PT with 24/7 assist at facility or with family depending on acute stay progress and level of assist family can provide.     Recommendations for follow up therapy are one component of a multi-disciplinary discharge planning process, led by the attending physician.  Recommendations may be updated based on patient status, additional functional criteria and insurance authorization.  Follow Up Recommendations Can patient physically be transported by private vehicle: No     Assistance Recommended at Discharge Frequent or constant Supervision/Assistance  Patient can return home with the following  Two people to help with walking and/or transfers;A lot of help with bathing/dressing/bathroom;Assistance with cooking/housework;Assist for transportation;Help with stairs or ramp for entrance    Equipment Recommendations None recommended by PT  Recommendations for Other Services  OT consult    Functional Status Assessment Patient has had a recent decline in their functional status and demonstrates the ability to make significant improvements in function in a reasonable and predictable amount of time.      Precautions / Restrictions Precautions Precautions: Fall Precaution Comments: Pt buckled standing at bedside Restrictions Weight Bearing Restrictions: No      Mobility  Bed Mobility Overal bed mobility: Needs Assistance Bed Mobility: Supine to Sit, Sit to Supine     Supine to sit: HOB elevated, Min assist Sit to supine: Total assist, +2 for physical assistance   General bed mobility comments: Increased time with steady assist to sit up and scoot to EOB sitting.  Total assist of two back to bed 2* pt buckling and appearing to pass out    Transfers Overall transfer level: Needs assistance Equipment used: Rolling walker (2 wheels) Transfers: Sit to/from Stand Sit to Stand: Min assist, +2 safety/equipment, From elevated surface           General transfer comment: Assist for balance up from high gurney.  TOTAL assist of two for return to bed after pt buckled with eyes rolling back and non-responsive    Ambulation/Gait     Assistive device: Rolling walker (2 wheels)         General Gait Details: Pt stood at side of bed with RW ~1 minute before buckling and becoming non-responsive  Stairs            Wheelchair Mobility    Modified Rankin (Stroke Patients Only)       Balance Overall balance assessment: Needs assistance Sitting-balance support: No upper extremity supported, Feet supported Sitting balance-Leahy Scale: Fair     Standing balance support: Bilateral upper extremity supported Standing balance-Leahy Scale: Poor  Pertinent Vitals/Pain Pain Assessment Pain Assessment: No/denies pain    Home Living Family/patient expects to be discharged to:: Unsure                   Additional Comments: Pt living with family who would prefer to take her home if she is at a level they can safely care for her.    Prior Function Prior Level of Function : Patient poor historian/Family not available              Mobility Comments: Pt states she used a walker       Hand Dominance        Extremity/Trunk Assessment   Upper Extremity Assessment Upper Extremity Assessment: Overall WFL for tasks assessed;Generalized weakness    Lower Extremity Assessment Lower Extremity Assessment: Overall WFL for tasks assessed;Generalized weakness       Communication   Communication: No difficulties (Mumbles and short phrased only)  Cognition Arousal/Alertness: Awake/alert Behavior During Therapy: WFL for tasks assessed/performed, Flat affect Overall Cognitive Status: History of cognitive impairments - at baseline                                 General Comments: Pt with hx of dementia and able to state she lived with family, used a walker and thought she was "at the clinic"        General Comments      Exercises     Assessment/Plan    PT Assessment Patient needs continued PT services  PT Problem List Decreased strength;Decreased activity tolerance;Decreased balance;Decreased mobility;Decreased cognition;Decreased knowledge of use of DME;Obesity       PT Treatment Interventions DME instruction;Gait training;Functional mobility training;Therapeutic activities;Therapeutic exercise;Balance training;Patient/family education    PT Goals (Current goals can be found in the Care Plan section)  Acute Rehab PT Goals Patient Stated Goal: No goals expressed.  Pt agreeable to participate with PT PT Goal Formulation: Patient unable to participate in goal setting Time For Goal Achievement: 07/28/22 Potential to Achieve Goals: Fair    Frequency Min 1X/week     Co-evaluation               AM-PAC PT "6 Clicks" Mobility  Outcome Measure Help needed turning from your back to your side while in a flat bed without using bedrails?: A Little Help needed moving from lying on your back to sitting on the side of a flat bed without using bedrails?: A Little Help needed moving to and  from a bed to a chair (including a wheelchair)?: Total Help needed standing up from a chair using your arms (e.g., wheelchair or bedside chair)?: A Little Help needed to walk in hospital room?: Total Help needed climbing 3-5 steps with a railing? : Total 6 Click Score: 12    End of Session Equipment Utilized During Treatment: Gait belt Activity Tolerance: Other (comment) Patient left: in bed;with call bell/phone within reach;with bed alarm set Nurse Communication: Mobility status;Other (comment) PT Visit Diagnosis: Unsteadiness on feet (R26.81);Muscle weakness (generalized) (M62.81);History of falling (Z91.81);Difficulty in walking, not elsewhere classified (R26.2)    Time: 1040-1057 PT Time Calculation (min) (ACUTE ONLY): 17 min   Charges:   PT Evaluation $PT Eval Low Complexity: 1 Low          Mauro Kaufmann PT Acute Rehabilitation Services Pager [phone] Office [phone]   [name] [name] 07/07/2022, 11:38 AM

## 2022-07-07 NOTE — ED Provider Notes (Signed)
Accepted handoff at shift change from [facility]. Please see prior provider note for full HPI.  Briefly: Patient is a 88 y.o. female who presents to the ER for generalized weakness x 1 week. Initially went to [facility] ED, sent to [facility] for imaging.  DDX/Plan: F/u on brain MRI. Anticipate if normal, board for PT/OT/TOC.   0830 -- Case discussed with social work [employer]. She was able to discuss with the patient's daughter, who feels more comfortable with home health instead of SNF placement. They want to wait until full medical clearance after MRI, then undergo PT/OT evalutation. Social work/TOC team plans to facilitate home health.   1115 -- PT evaluated patient and recommended acute stay either inpatient or at facility.   1224 -- Brain MRI normal.  1310 -- TOC discussed with family about findings of MRI and PT evaluation. They would like to pursue SNF placement. Patient will be placed as ED border. Diet and home medications already ordered by previous provider.   1345 -- Troponin, originally ordered at 1am, collected and resulted at 25. Patient not having any chest pain and EKG without ischemic findings. Placed in boarding status. Delta troponin ordered for 2 hours after initial. Anticipate if stable or downtrending, continue with plan for SNF placement.   Patient discussed and oncoming provider Trudie Buckler made aware of pending troponin, will follow up.     Jeanella Flattery 07/07/22 1413    Gloris Manchester, MD 07/07/22 1434

## 2022-07-07 NOTE — ED Notes (Signed)
PT at bedside.

## 2022-07-07 NOTE — ED Notes (Signed)
Pt is difficult stick with multiple attempts.  Called lab to help obtain troponin, states will have someone available around 0400.

## 2022-07-07 NOTE — [place] FL2 (Signed)
[place] MEDICAID FL2 LEVEL OF CARE FORM     IDENTIFICATION  Patient Name: [name] [name] Birthdate: 1934-01-23 Sex: female Admission Date (Current Location): 07/06/2022  [facility] and [place] Number:  Producer, television/film/video and Address:  [facility],  [address], [place] [address]      Provider Number: [id number]  Attending Physician Name and Address:  Gloris Manchester, MD  Relative Name and Phone Number:  Linda/ Daughter  [phone]    Current Level of Care: Hospital Recommended Level of Care: Skilled Nursing Facility Prior Approval Number:    Date Approved/Denied: 07/07/22 PASRR Number: [id number]  Discharge Plan: SNF    Current Diagnoses: Patient Active Problem List   Diagnosis Date Noted   Obesity 11/03/2014   Unstable angina (HCC) 09/24/2013   Type 2 diabetes mellitus with peripheral circulatory disorder (HCC) 10/12/2012   Midsternal chest pain 09/07/2012   Postoperative anemia due to acute blood loss 08/28/2012   S/P AVR (aortic valve replacement) 09/19/2010   CAD (coronary artery disease)    OA (osteoarthritis)    Hyperlipidemia    Degenerative arthritis of knee    Diabetes mellitus type II, non insulin dependent (HCC) 06/28/2010   Hypertension    Aortic stenosis     Orientation RESPIRATION BLADDER Height & Weight     Self, Time, Situation, Place  Normal Continent Weight:   Height:  5' 1.5" (156.2 cm)  BEHAVIORAL SYMPTOMS/MOOD NEUROLOGICAL BOWEL NUTRITION STATUS      Continent    AMBULATORY STATUS COMMUNICATION OF NEEDS Skin   Limited Assist Verbally Normal                       Personal Care Assistance Level of Assistance  Bathing, Feeding, Dressing Bathing Assistance: Limited assistance Feeding assistance: Independent Dressing Assistance: Limited assistance     Functional Limitations Info  Sight, Hearing, Speech Sight Info: Adequate Hearing Info: Adequate Speech Info: Adequate    SPECIAL CARE FACTORS FREQUENCY   PT (By licensed PT)     PT Frequency: X5              Contractures Contractures Info: Not present    Additional Factors Info  Code Status, Allergies Code Status Info: Prior Allergies Info: Crestor (rosuvastatin Calcium) High Allergy Other (See Comments) Breathing issues and chest pain Lipitor (atorvastatin Calcium) High Allergy Other (See Comments) Breathing issues and chest pain Norvasc (amlodipine Besylate) Medium Allergy Swelling  Aspirin           Current Medications (07/07/2022):  This is the current hospital active medication list Current Facility-Administered Medications  Medication Dose Route Frequency Provider Last Rate Last Admin   amLODipine (NORVASC) tablet 5 mg  5 mg Oral Once Carroll Sage, PA-C       bumetanide (BUMEX) tablet 1 mg  1 mg Oral Daily Carroll Sage, PA-C   1 mg at 07/07/22 1008   carvedilol (COREG) tablet 25 mg  25 mg Oral BID Carroll Sage, PA-C   25 mg at 07/07/22 1008   clopidogrel (PLAVIX) tablet 75 mg  75 mg Oral Daily Carroll Sage, PA-C   75 mg at 07/07/22 1008   donepezil (ARICEPT) tablet 10 mg  10 mg Oral QHS Carroll Sage, PA-C       linagliptin (TRADJENTA) tablet 5 mg  5 mg Oral Daily Carroll Sage, PA-C   5 mg at 07/07/22 1008   rosuvastatin (CRESTOR) tablet 40 mg  40 mg Oral  Daily Carroll Sage, PA-C   40 mg at 07/07/22 1008   Current Outpatient Medications  Medication Sig Dispense Refill   carvedilol (COREG) 25 MG tablet Take 1 tablet (25 mg total) by mouth 2 (two) times daily. 180 tablet 1   clopidogrel (PLAVIX) 75 MG tablet Take 1 tablet (75 mg total) by mouth daily. 90 tablet 3   metFORMIN (GLUCOPHAGE) 1000 MG tablet Take 1 tablet (1,000 mg total) by mouth 2 (two) times daily with a meal. 180 tablet 3   pravastatin (PRAVACHOL) 80 MG tablet Take 1 tablet (80 mg total) by mouth daily. 30 tablet 3     Discharge Medications: Please see discharge summary for a list of discharge  medications.  Relevant Imaging Results:  Relevant Lab Results:   Additional Information SSN# [id number]  Georgie Chard, LCSW

## 2022-07-07 NOTE — ED Provider Notes (Signed)
Patient was received as a transfer from [facility] emergency department please see original providers note for full detail  In short patient with medical history including diabetes, PAD, CKD stage IV, CHF, dementia presenting with complaints of generalized weakness.  Unable to obtain HPI from patient but family is at bedside and states that over the last week patient has been having generalized weakness, typically patient can ambulate with a walker with help, but starting yesterday patient was unable to stand up and walk on her own, they state that she has had decrease in appetite, but she has had no associated fevers chills cough congestion no stomach pain nausea vomiting diarrhea no recent head trauma.  They state that she is at her normal mental status but just seems to be weaker.  Patient was seen at [facility] emergency department, basic lab workup was obtained and were grossly unremarkable, she was sent to [facility] for CT imaging since her CT scan was unavailable at that time. Physical Exam  BP (!) 123/52   Pulse (!) 56   Temp 97.8 F (36.6 C) (Oral)   Resp 18   Ht 5' 1.5" (1.562 m)   SpO2 99%   BMI 36.25 kg/m   Physical Exam Vitals and nursing note reviewed.  Constitutional:      General: She is not in acute distress.    Appearance: She is not ill-appearing.  HENT:     Head: Normocephalic and atraumatic.     Comments: No gross deformity of  the head present no raccoon eyes or Battle sign noted.    Nose: No congestion.     Mouth/Throat:     Mouth: Mucous membranes are moist.     Pharynx: Oropharynx is clear. No oropharyngeal exudate or posterior oropharyngeal erythema.     Comments: No trismus no torticollis no oral trauma Eyes:     Extraocular Movements: Extraocular movements intact.     Conjunctiva/sclera: Conjunctivae normal.     Pupils: Pupils are equal, round, and reactive to light.  Cardiovascular:     Rate and Rhythm: Regular rhythm. Bradycardia present.     Pulses:  Normal pulses.     Heart sounds: No murmur heard.    No friction rub. No gallop.  Pulmonary:     Effort: No respiratory distress.     Breath sounds: No wheezing, rhonchi or rales.  Abdominal:     Palpations: Abdomen is soft.     Tenderness: There is no abdominal tenderness. There is no right CVA tenderness or left CVA tenderness.  Musculoskeletal:     Comments: Patient is moving her upper and lower extremities without difficulty.  Skin:    General: Skin is warm and dry.  Neurological:     Mental Status: She is alert.     GCS: GCS eye subscore is 4. GCS verbal subscore is 5. GCS motor subscore is 6.     Cranial Nerves: Cranial nerves 2-12 are intact. No cranial nerve deficit.     Sensory: Sensation is intact.     Motor: No weakness.     Coordination: Romberg sign negative. Finger-Nose-Finger Test normal.     Comments: Cranial nerves II through XII grossly intact, no difficulty with word finding, can follow two-step commands, there is no unilateral weakness present my exam.  Psychiatric:        Mood and Affect: Mood normal.     Procedures  Procedures  ED Course / MDM   Clinical Course as of 07/07/22 1610  Sat Jul 06, 2022  19[address] Z[address] for generalized weakness and decreased mental status since this morning.  She has no focal deficits on exam.  Prior notes from urology from June 25, 2022 note diagnosis of stage IV CKD.  I do not have recent lab values however.  She also has diabetes and hypertension by history.  Labs, urine and chest x-ray at this time. [CB]  2132 Patient has been using a walker for the past several months, has not been to walk since yesterday with a walker, today could not even stand with the VNA assistance and when trying to get her up she fell forward and hit her face on her walker.  Family reports that she "went unresponsive".  In the ED she is at her mental baseline per her family, she has no focal neurologic deficits, she can lift her extremities off the bed.  Labs  are reassuring.  Discussed with family given Plavix use and head injury she needs to have a CT scan of her head.  Unfortunately our CT scanner here is down and she was to be transferred down to [facility]. [CB]    Clinical Course User Index [CB] Ma Rings, PA-C   Medical Decision Making Amount and/or Complexity of Data Reviewed Labs: ordered. Radiology: ordered.  Risk Prescription drug management.    Lab Tests:  I Ordered, and personally interpreted labs.  The pertinent results include: CBC shows normocytic anemia hemoglobin 11.6, BMP reveals potassium 3.2, glucose 112, BUN 50, creatinine 3.3, GFR 13, UA is unremarkable   Imaging Studies ordered:  I ordered imaging studies including chest x-ray, CT head, C-spine, I independently visualized and interpreted imaging which showed all imaging negative for acute findings I agree with the radiologist interpretation   Cardiac Monitoring:  The patient was maintained on a cardiac monitor.  I personally viewed and interpreted the cardiac monitored which showed an underlying rhythm of: EKG without signs of ischemia   Medicines ordered and prescription drug management:  I ordered medication including home med  I have reviewed the patients home medicines and have made adjustments as needed  Critical Interventions:  N/A   Reevaluation:  Presents with generalized weakness from [facility], reviewed basic lab workup grossly unremarkable, CT head and C-spine were ordered, they are also unremarkable.  She had a benign physical exam, had extensive conversation with family in regards discharge with PT OT follow-up, they are uncomfortable with this as they are unable to manage patient since she is now unable to ambulate.  At this time I cannot fully exclude possible CVA since we do not have MRI at this time, will hold patient for MRI if unremarkable patient will be made in a bed hold and await PT OT eval as well as TOC evaluation.   Family is in agreement this plan  Consultations Obtained:  N/A    Test Considered:  N/A    Rule out low suspicion for internal head bleed and or mass as CT imaging is negative for acute findings.  Low suspicion for CVA she has no focal deficit present my exam.  Low suspicion for dissection of the vertebral or carotid artery as presentation atypical of etiology.  Low suspicion for meningitis as she has no meningeal sign present.  I doubt infectious etiology as patient is nontoxic-appearing vital signs reassuring she is nontachypneic nonhypoxic no leukocytosis.  I doubt metabolic abnormality causing generalized weakness as her lab work is grossly unremarkable.  She does have a creatinine of  3.33, not see recent creatinine levels but after reviewing her chart she has documented CKD stage IV, I suspect this is likely her baseline.  I do not feel patient needs emergent hemodialysis as she has no peripheral edema no pleural effusions no significant electrolyte derailment.     Dispostion and problem list  Due to shift change patient be handed off to [facility]  Follow-up on MRI head, if unremarkable please patient has a ED hold, and await PT OT eval as well as TOC evaluation.  As I suspect patient will likely need SNF placement.           Carroll Sage, PA-C 07/07/22 0645    Maia Plan, MD 07/10/22 [phone]

## 2022-07-07 NOTE — Progress Notes (Signed)
Transition of Care [facility]) - Emergency Department Mini Assessment   Patient Details  Name: [name] [name] MRN: [id number] Date of Birth: 09-Apr-1934  [facility] CM/SW Contact:    Georgie Chard, LCSW Phone Number: 07/07/2022, 8:41 AM   Clinical Narrative: This CSW has spoken to the patient's daughter. AT this time the daughter is unsure if she would like the mother/ patient to go to a SNF. The daughter would like the mom to have a medical work up to determine the mom/ patient' safety back home. AT this time the patient is awaiting PT as well a MRI. This CSW will reach back out to the family once the patient has medical clearance. The family than states they will decide based on those results. The patient does have INS to move from ED to SNF. This CSW also explained HH which the family states that would be better if the mom's result come back well. TOC will continue to follow the patient for further review.   ED Mini Assessment: What brought you to the Emergency Department? : Trasferred from [facility]  Barriers to Discharge: No Barriers Identified        Interventions which prevented an admission or readmission: SNF Placement, Home Health Consult or Services    Patient Contact and Communications     Spoke with: (P) Daughter/Linda Contact Date: 07/07/22,     Contact Phone Number: (P) [phone]    Patient states their goals for this hospitalization and ongoing recovery are:: (P) Family wants to make sure patient did not have a stroke      Admission diagnosis:  weakness Patient Active Problem List   Diagnosis Date Noted   Obesity 11/03/2014   Unstable angina (HCC) 09/24/2013   Type 2 diabetes mellitus with peripheral circulatory disorder (HCC) 10/12/2012   Midsternal chest pain 09/07/2012   Postoperative anemia due to acute blood loss 08/28/2012   S/P AVR (aortic valve replacement) 09/19/2010   CAD (coronary artery disease)    OA (osteoarthritis)     Hyperlipidemia    Degenerative arthritis of knee    Diabetes mellitus type II, non insulin dependent (HCC) 06/28/2010   Hypertension    Aortic stenosis    PCP:  Legrand Pitts, PA Pharmacy:   [id number] - [place], [place] - [address] [place] [address] Phone: [phone] Fax: [phone]

## 2022-07-07 NOTE — ED Notes (Signed)
Called lab again to help collect blood.

## 2022-07-07 NOTE — NC FL2 (Signed)
[place] MEDICAID FL2 LEVEL OF CARE FORM     IDENTIFICATION  Patient Name: [name] [name] Birthdate: 06/06/1934 Sex: female Admission Date (Current Location): 07/06/2022  County and Medicaid Number:  [place]   Facility and Address:  [facility],  [address], [place] [address]      Provider Number: [id number]  Attending Physician Name and Address:  Dixon, Ryan, MD  Relative Name and Phone Number:  Linda/ Daughter  [phone]    Current Level of Care: Hospital Recommended Level of Care: Skilled Nursing Facility Prior Approval Number:    Date Approved/Denied: 07/07/22 PASRR Number: [id number]  Discharge Plan: SNF    Current Diagnoses: Patient Active Problem List   Diagnosis Date Noted   Obesity 11/03/2014   Unstable angina (HCC) 09/24/2013   Type 2 diabetes mellitus with peripheral circulatory disorder (HCC) 10/12/2012   Midsternal chest pain 09/07/2012   Postoperative anemia due to acute blood loss 08/28/2012   S/P AVR (aortic valve replacement) 09/19/2010   CAD (coronary artery disease)    OA (osteoarthritis)    Hyperlipidemia    Degenerative arthritis of knee    Diabetes mellitus type II, non insulin dependent (HCC) 06/28/2010   Hypertension    Aortic stenosis     Orientation RESPIRATION BLADDER Height & Weight     Self, Time, Situation, Place  Normal Continent Weight:   Height:  5' 1.5" (156.2 cm)  BEHAVIORAL SYMPTOMS/MOOD NEUROLOGICAL BOWEL NUTRITION STATUS      Continent    AMBULATORY STATUS COMMUNICATION OF NEEDS Skin   Limited Assist Verbally Normal                       Personal Care Assistance Level of Assistance  Bathing, Feeding, Dressing Bathing Assistance: Limited assistance Feeding assistance: Independent Dressing Assistance: Limited assistance     Functional Limitations Info  Sight, Hearing, Speech Sight Info: Adequate Hearing Info: Adequate Speech Info: Adequate    SPECIAL CARE FACTORS FREQUENCY   PT (By licensed PT)     PT Frequency: X5              Contractures Contractures Info: Not present    Additional Factors Info  Code Status, Allergies Code Status Info: Prior Allergies Info: Crestor (rosuvastatin Calcium) High Allergy Other (See Comments) Breathing issues and chest pain Lipitor (atorvastatin Calcium) High Allergy Other (See Comments) Breathing issues and chest pain Norvasc (amlodipine Besylate) Medium Allergy Swelling  Aspirin           Current Medications (07/07/2022):  This is the current hospital active medication list Current Facility-Administered Medications  Medication Dose Route Frequency Provider Last Rate Last Admin   amLODipine (NORVASC) tablet 5 mg  5 mg Oral Once Faulkner, William J, PA-C       bumetanide (BUMEX) tablet 1 mg  1 mg Oral Daily Faulkner, William J, PA-C   1 mg at 07/07/22 1008   carvedilol (COREG) tablet 25 mg  25 mg Oral BID Faulkner, William J, PA-C   25 mg at 07/07/22 1008   clopidogrel (PLAVIX) tablet 75 mg  75 mg Oral Daily Faulkner, William J, PA-C   75 mg at 07/07/22 1008   donepezil (ARICEPT) tablet 10 mg  10 mg Oral QHS Faulkner, William J, PA-C       linagliptin (TRADJENTA) tablet 5 mg  5 mg Oral Daily Faulkner, William J, PA-C   5 mg at 07/07/22 1008   rosuvastatin (CRESTOR) tablet 40 mg  40 mg Oral   Daily Faulkner, William J, PA-C   40 mg at 07/07/22 1008   Current Outpatient Medications  Medication Sig Dispense Refill   carvedilol (COREG) 25 MG tablet Take 1 tablet (25 mg total) by mouth 2 (two) times daily. 180 tablet 1   clopidogrel (PLAVIX) 75 MG tablet Take 1 tablet (75 mg total) by mouth daily. 90 tablet 3   metFORMIN (GLUCOPHAGE) 1000 MG tablet Take 1 tablet (1,000 mg total) by mouth 2 (two) times daily with a meal. 180 tablet 3   pravastatin (PRAVACHOL) 80 MG tablet Take 1 tablet (80 mg total) by mouth daily. 30 tablet 3     Discharge Medications: Please see discharge summary for a list of discharge  medications.  Relevant Imaging Results:  Relevant Lab Results:   Additional Information SSN# [id number]  Devell Parkerson W Godwin Tedesco, LCSW     

## 2022-07-07 NOTE — Progress Notes (Addendum)
Addend@ 11:07 am  This CSW has obtained the patient's PASRR in the case the family would like SNF placement after PT evaluation.  [id number]   CSW spoke to the patient at this time the patient is still awaiting PT as well MRI. This CSW has reached out to PT to the status of evaluation. This CSW also spoke to the current nurse to see about MRI. This CSW will continue to follow. There has been no response from PT at this time. This could be due to the weekend. TOC will continue to follow up to see where the patient's status is.

## 2022-07-08 NOTE — Progress Notes (Addendum)
This CSW has [facility]reached to [facility] to inquire about bed offer. No response as of yet. TOC following.   Addend @ 9:17 AM [facility] has declined. Daughter informed via phone. Bed offers presented. Will follow up with daughter regarding bed selection.   Addend @ 9:52 AM Daughter accepted [facility]. Olegario Messier notified. This CSW will attempt to initiate Auth.  Addend @ 9:59 AM Auth initiated.

## 2022-07-08 NOTE — ED Notes (Signed)
Patients purwich was changed due to it being 4 days old

## 2022-07-08 NOTE — ED Notes (Signed)
Pt currently denies any needs or complaints at this time, will continue to monitor.

## 2022-07-08 NOTE — ED Notes (Signed)
Pt was provided perineal care, new brief applied, new bed-chuck applied, pt repositioned to comfort.

## 2022-07-08 NOTE — ED Notes (Signed)
Assisted pt with bedpan for bowel movement.

## 2022-07-08 NOTE — Progress Notes (Addendum)
  Addend@ 5:48   AUTH was approved per provider by Dr. Marcy Siren. This CSW has checked [employer] as well to check approval. This CSW has sent a text to Kia at [facility] to also make her aware of the patient's approval. At this time there has been no response of confirmation from [facility]. This CSW did let the daughter Bonita Quin know of the mothers approval, also that the patient will DC at some time tomorrow and the AM CSW will update the daughter once [facility] confirms time of DC. TOC will continue to follow and send report and bed # once received.    Patient's AUTH went to Peer to Peer. This CSW has made provider aware. Awaiting final decision. This CSW has contacted the family in regards to this. The family is aware that the patient will have to DC home with [facility] if patient is denied. TOC will continue to update.

## 2022-07-08 NOTE — ED Provider Notes (Signed)
Emergency Medicine Observation Re-evaluation Note  [name] [name] is a 88 y.o. female, seen on rounds today.  Pt initially presented to the ED for complaints of Weakness Currently, the patient is awaiting SNF.  Patient denies needs at this time.  She reports she is not hungry and is comfortable.  No pain complaints.  Physical Exam  BP (!) 120/48 (BP Location: Left Arm)   Pulse 64   Temp 98.3 F (36.8 C) (Oral)   Resp 16   Ht 5' 1.5" (1.562 m)   SpO2 97%   BMI 36.25 kg/m  Physical Exam General: Patient is awake, lying in bed.  No respiratory distress. Cardiac: Regular. Lungs: Clear to auscultation. Psych: Calm. Lower extremity: No peripheral edema.  Calf musculature good.  Calves are soft and pliable.  No swelling of the feet or lower legs.  ED Course / MDM  EKG:EKG Interpretation  Date/Time:  Saturday July 06 2022 18:29:54 EDT Ventricular Rate:  63 PR Interval:  142 QRS Duration: 130 QT Interval:  497 QTC Calculation: 509 R Axis:   -20 Text Interpretation: Sinus rhythm Right bundle branch block When compared to prior RBBB now present Confirmed by Vonita Moss [phone]) on 07/06/2022 7:49:14 PM  I have reviewed the labs performed to date as well as medications administered while in observation.  Recent changes in the last 24 hours include social work notified that insurer is requiring peer to peer review..  Plan  Current plan is for peer to peer review. 17: 25 spent 20 minutes with peer to peer review getting patient demographics established and then and speaking with Dr.Olaleye reviewing the patient's EMR record and my recent evaluation.  Patient will be approved for SNF placement for rehab.    Arby Barrette, MD 07/08/22 1729

## 2022-07-08 NOTE — ED Provider Notes (Signed)
Emergency Medicine Observation Re-evaluation Note  [name] RENNAKER is a 88 y.o. female, seen on rounds today.  Pt initially presented to the ED for complaints of Weakness Currently, the patient is awaiting nursing home placement.  Physical Exam  BP (!) 120/48 (BP Location: Left Arm)   Pulse (!) 55   Temp 98.3 F (36.8 C) (Oral)   Resp 16   Ht 5' 1.5" (1.562 m)   SpO2 98%   BMI 36.25 kg/m  Physical Exam Overweight and in no acute distress  ED Course / MDM  EKG:EKG Interpretation  Date/Time:  Saturday July 06 2022 18:29:54 EDT Ventricular Rate:  63 PR Interval:  142 QRS Duration: 130 QT Interval:  497 QTC Calculation: 509 R Axis:   -20 Text Interpretation: Sinus rhythm Right bundle branch block When compared to prior RBBB now present Confirmed by Vonita Moss [phone]) on 07/06/2022 7:49:14 PM  I have reviewed the labs performed to date as well as medications administered while in observation.  Recent changes in the last 24 hours include none  Plan  Current plan is for nursing home placement.    Bethann Berkshire, MD 07/08/22 [phone]

## 2022-07-08 NOTE — ED Notes (Signed)
Provided pt with ice water per pt request.

## 2022-07-09 LAB — BASIC METABOLIC PANEL
Anion gap: 10 (ref 5–15)
BUN: 41 mg/dL — ABNORMAL HIGH (ref 8–23)
CO2: 26 mmol/L (ref 22–32)
Calcium: 8.3 mg/dL — ABNORMAL LOW (ref 8.9–10.3)
Chloride: 103 mmol/L (ref 98–111)
Creatinine, Ser: 2.68 mg/dL — ABNORMAL HIGH (ref 0.44–1.00)
GFR, Estimated: 17 mL/min — ABNORMAL LOW (ref >60)
Glucose, Bld: 162 mg/dL — ABNORMAL HIGH (ref 70–99)
Potassium: 3.2 mmol/L — ABNORMAL LOW (ref 3.5–5.1)
Sodium: 139 mmol/L (ref 135–145)

## 2022-07-09 MED ORDER — CLOPIDOGREL BISULFATE 75 MG PO TABS
75.0000 mg | ORAL_TABLET | Freq: Every day | ORAL | 3 refills | Status: AC
Start: 2022-07-09 — End: ?

## 2022-07-09 MED ORDER — SITAGLIPTIN PHOSPHATE 25 MG PO TABS: 25.0000 mg | ORAL_TABLET | Freq: Every day | ORAL | 0 refills | Status: AC

## 2022-07-09 MED ORDER — CARVEDILOL 25 MG PO TABS: 25.0000 mg | ORAL_TABLET | Freq: Two times a day (BID) | ORAL | 1 refills | Status: AC

## 2022-07-09 MED ORDER — DONEPEZIL HCL 10 MG PO TABS: 10.0000 mg | ORAL_TABLET | Freq: Every day | ORAL | 0 refills | Status: AC

## 2022-07-09 NOTE — Discharge Instructions (Addendum)
Please make sure patient is drinking plenty of fluids She will need to have her creatinine rechecked in 3 days Do not give Bumex or amlodipine until after recheck of creatinine and restart at physician discretion

## 2022-07-09 NOTE — ED Provider Notes (Signed)
Emergency Medicine Observation Re-evaluation Note  [name] [name] is a 88 y.o. female, seen on rounds today.  Pt initially presented to the ED for complaints of Weakness Currently, the patient is 88 year old female seen and evaluated at [facility] after fall.  Patient with significant history of dementia, diabetes, peripheral arterial disease, CKD stage IV, CHF who had presented with generalized weakness.  She was initially transferred to [facility] to have CT scans did not have that available.  Per report CT and labs are stable.  Patient does have elevated creatinine at 3.3 and last 1 in the system was 1, 5 years ago.  I reviewed care everywhere and do not find any more recent creatinines.  It was thought that this was stable given her stage III kidney disease  Physical Exam  BP (!) 118/53 (BP Location: Right Arm)   Pulse (!) 57   Temp 98.6 F (37 C) (Oral)   Resp 14   Ht 1.562 m (5' 1.5")   SpO2 100%   BMI 36.25 kg/m  Physical Exam General: Elderly female laying in bed who makes eye contact and interacts Cardiac: Normal heart rate normal blood pressure Lungs: No respiratory distress normal oxygen saturations Psych: Patient is awake alert and answers my questions  ED Course / MDM  EKG:EKG Interpretation  Date/Time:  Saturday July 06 2022 18:29:54 EDT Ventricular Rate:  63 PR Interval:  142 QRS Duration: 130 QT Interval:  497 QTC Calculation: 509 R Axis:   -20 Text Interpretation: Sinus rhythm Right bundle branch block When compared to prior RBBB now present Confirmed by Vonita Moss [phone]) on 07/06/2022 7:49:14 PM  I have reviewed the labs performed to date as well as medications administered while in observation.  Recent changes in the last 24 hours include patient to be admitted to skilled nursing facility.  Plan  Current plan is for we will recheck creatinine to make sure it has been stable for several days Repeat creatinine 2.68 Patient taking po-patient drank 12 cc  for me while in the room Will need recheck in one week   Margarita Grizzle, MD 07/09/22 1242

## 2022-07-09 NOTE — Progress Notes (Addendum)
Awaiting response from [employer] rep regarding d/c time.   Addend @ 9:40 AM Informed EDP and RN via secure chat of d/c plan. Staff currently reassessing vitals/labs to ensure pt is medically stable for d/c. RN provided with room and report information. PTAR to transport.

## 2022-07-09 NOTE — ED Notes (Signed)
Patient was checked my Clinical research associate to make sure she wasn't wet. Patient is dry. Purewick intact.   Pt was a little agitated with me checking her but was redirected.

## 2022-07-09 NOTE — ED Notes (Signed)
Patient discharged off unit to skilled facility per provider. Patient  alert and cooperative. No s/s of distress at this time. Patient  discharge information given to  [facility] staff for transport. Patient off unit on stretcher, escorted and transported by PTAR.Marland Kitchen
